# Patient Record
Sex: Male | Born: 1960 | Race: Black or African American | Hispanic: No | Smoking: Current every day smoker
Health system: Southern US, Community
[De-identification: ages and names within clinical notes are randomized; demographics above are authoritative.]

## PROBLEM LIST (undated history)

## (undated) DIAGNOSIS — G8929 Other chronic pain: Secondary | ICD-10-CM

## (undated) DIAGNOSIS — K219 Gastro-esophageal reflux disease without esophagitis: Secondary | ICD-10-CM

## (undated) DIAGNOSIS — F4310 Post-traumatic stress disorder, unspecified: Secondary | ICD-10-CM

## (undated) DIAGNOSIS — I10 Essential (primary) hypertension: Secondary | ICD-10-CM

## (undated) DIAGNOSIS — E7800 Pure hypercholesterolemia, unspecified: Secondary | ICD-10-CM

## (undated) DIAGNOSIS — E119 Type 2 diabetes mellitus without complications: Secondary | ICD-10-CM

## (undated) DIAGNOSIS — R569 Unspecified convulsions: Secondary | ICD-10-CM

## (undated) DIAGNOSIS — F319 Bipolar disorder, unspecified: Secondary | ICD-10-CM

## (undated) DIAGNOSIS — F1910 Other psychoactive substance abuse, uncomplicated: Secondary | ICD-10-CM

## (undated) DIAGNOSIS — M549 Dorsalgia, unspecified: Secondary | ICD-10-CM

## (undated) HISTORY — DX: Gastro-esophageal reflux disease without esophagitis: K21.9

## (undated) HISTORY — PX: OTHER SURGICAL HISTORY: SHX169

---

## 2000-06-23 ENCOUNTER — Emergency Department (HOSPITAL_COMMUNITY): Admission: EM | Admit: 2000-06-23 | Discharge: 2000-06-23 | Payer: Self-pay | Admitting: Emergency Medicine

## 2000-06-23 ENCOUNTER — Encounter: Payer: Self-pay | Admitting: Emergency Medicine

## 2000-09-23 ENCOUNTER — Encounter: Payer: Self-pay | Admitting: Emergency Medicine

## 2000-09-23 ENCOUNTER — Emergency Department (HOSPITAL_COMMUNITY): Admission: EM | Admit: 2000-09-23 | Discharge: 2000-09-23 | Payer: Self-pay | Admitting: Emergency Medicine

## 2001-04-24 ENCOUNTER — Emergency Department (HOSPITAL_COMMUNITY): Admission: EM | Admit: 2001-04-24 | Discharge: 2001-04-24 | Payer: Self-pay | Admitting: Emergency Medicine

## 2003-10-14 ENCOUNTER — Emergency Department (HOSPITAL_COMMUNITY): Admission: EM | Admit: 2003-10-14 | Discharge: 2003-10-14 | Payer: Self-pay | Admitting: Emergency Medicine

## 2003-10-16 ENCOUNTER — Emergency Department (HOSPITAL_COMMUNITY): Admission: EM | Admit: 2003-10-16 | Discharge: 2003-10-16 | Payer: Self-pay | Admitting: Emergency Medicine

## 2004-09-29 ENCOUNTER — Emergency Department: Payer: Self-pay | Admitting: Emergency Medicine

## 2005-10-05 ENCOUNTER — Emergency Department (HOSPITAL_COMMUNITY): Admission: EM | Admit: 2005-10-05 | Discharge: 2005-10-05 | Payer: Self-pay | Admitting: Emergency Medicine

## 2008-07-12 ENCOUNTER — Emergency Department (HOSPITAL_COMMUNITY): Admission: EM | Admit: 2008-07-12 | Discharge: 2008-07-12 | Payer: Self-pay | Admitting: Emergency Medicine

## 2008-07-29 ENCOUNTER — Emergency Department (HOSPITAL_COMMUNITY): Admission: EM | Admit: 2008-07-29 | Discharge: 2008-07-29 | Payer: Self-pay | Admitting: Emergency Medicine

## 2010-05-26 LAB — GLUCOSE, CAPILLARY: Glucose-Capillary: 92 mg/dL (ref 70–99)

## 2011-06-16 ENCOUNTER — Emergency Department (HOSPITAL_COMMUNITY): Payer: Self-pay

## 2011-06-16 ENCOUNTER — Encounter (HOSPITAL_COMMUNITY): Payer: Self-pay | Admitting: *Deleted

## 2011-06-16 ENCOUNTER — Emergency Department (HOSPITAL_COMMUNITY)
Admission: EM | Admit: 2011-06-16 | Discharge: 2011-06-16 | Disposition: A | Discharge status: Home / Self Care | Payer: Self-pay | Attending: Emergency Medicine | Admitting: Emergency Medicine

## 2011-06-16 DIAGNOSIS — S92919A Unspecified fracture of unspecified toe(s), initial encounter for closed fracture: Secondary | ICD-10-CM | POA: Insufficient documentation

## 2011-06-16 DIAGNOSIS — F319 Bipolar disorder, unspecified: Secondary | ICD-10-CM | POA: Insufficient documentation

## 2011-06-16 DIAGNOSIS — Y92009 Unspecified place in unspecified non-institutional (private) residence as the place of occurrence of the external cause: Secondary | ICD-10-CM | POA: Insufficient documentation

## 2011-06-16 DIAGNOSIS — M79609 Pain in unspecified limb: Secondary | ICD-10-CM | POA: Insufficient documentation

## 2011-06-16 DIAGNOSIS — W208XXA Other cause of strike by thrown, projected or falling object, initial encounter: Secondary | ICD-10-CM | POA: Insufficient documentation

## 2011-06-16 HISTORY — DX: Bipolar disorder, unspecified: F31.9

## 2011-06-16 MED ORDER — HYDROCODONE-ACETAMINOPHEN 5-325 MG PO TABS
1.0000 | ORAL_TABLET | Freq: Once | ORAL | Status: AC
  Administered 2011-06-16: 1 via ORAL
  Filled 2011-06-16: qty 1

## 2011-06-16 MED ORDER — HYDROCODONE-ACETAMINOPHEN 5-325 MG PO TABS: 1.0000 | ORAL_TABLET | ORAL | Status: AC | PRN

## 2011-06-16 NOTE — Discharge Instructions (Signed)
Toe Fracture Your caregiver has diagnosed you as having a fractured toe. A toe fracture is a break in the bone of a toe. "Buddy taping" is a way of splinting your broken toe, by taping the broken toe to the toe next to it. This "buddy taping" will keep the injured toe from moving beyond normal range of motion. Buddy taping also helps the toe heal in a more normal alignment. It may take 6 to 8 weeks for the toe injury to heal. HOME CARE INSTRUCTIONS   Leave your toes taped together for as long as directed by your caregiver or until you see a doctor for a follow-up examination. You can change the tape after bathing. Always use a small piece of gauze or cotton between the toes when taping them together. This will help the skin stay dry and prevent infection.   Apply ice to the injury for 15 to 20 minutes each hour while awake for the first 2 days. Put the ice in a plastic bag and place a towel between the bag of ice and your skin.   After the first 2 days, apply heat to the injured area. Use heat for the next 2 to 3 days. Place a heating pad on the foot or soak the foot in warm water as directed by your caregiver.   Keep your foot elevated as much as possible to lessen swelling.   Wear sturdy, supportive shoes. The shoes should not pinch the toes or fit tightly against the toes.   Your caregiver may prescribe a rigid shoe if your foot is very swollen.   Your may be given crutches if the pain is too great and it hurts too much to walk.   Only take over-the-counter or prescription medicines for pain, discomfort, or fever as directed by your caregiver.   If your caregiver has given you a follow-up appointment, it is very important to keep that appointment. Not keeping the appointment could result in a chronic or permanent injury, pain, and disability. If there is any problem keeping the appointment, you must call back to this facility for assistance.  SEEK MEDICAL CARE IF:   You have increased pain  or swelling, not relieved with medications.   The pain does not get better after 1 week.   Your injured toe is cold when the others are warm.  SEEK IMMEDIATE MEDICAL CARE IF:   The toe becomes cold, numb, or white.   The toe becomes hot (inflamed) and red.  Document Released: 01/30/2000 Document Revised: 01/21/2011 Document Reviewed: 09/18/2007 [facility] Patient Information 2012 [place], [place].   You may take the hydrocodone prescribed for pain relief.  This will make you drowsy - do not drive within 4 hours of taking this medication.

## 2011-06-16 NOTE — ED Provider Notes (Signed)
History     CSN: [id number]  Arrival date & time 06/16/11  0906   First MD Initiated Contact with Patient 06/16/11 [phone]      Chief Complaint  Patient presents with  . Foot Injury    (Consider location/radiation/quality/duration/timing/severity/associated sxs/prior treatment) HPI Comments: Victor Palmer presents for evaluation of left foot pain after dropping a large TV on his distal foot yesterday evening.  He had immediate pain which has worsened upon waking this morning.  He has increased pain with weightbearing and attempts to flex his toes.  He has used ice and elevation with some relief, he has taken no medication this morning.  The pain does not radiate and it is constant and sharp.  The history is provided by the patient.    Past Medical History  Diagnosis Date  . Bipolar 1 disorder     No past surgical history on file.  No family history on file.  History  Substance Use Topics  . Smoking status: Not on file  . Smokeless tobacco: Not on file  . Alcohol Use:       Review of Systems  Musculoskeletal: Positive for joint swelling and arthralgias.  Skin: Negative for wound.  Neurological: Positive for weakness.    Allergies  Review of patient's allergies indicates no known allergies.  Home Medications   Current Outpatient Rx  Name Route Sig Dispense Refill  . HYDROCODONE-ACETAMINOPHEN 5-325 MG PO TABS Oral Take 1 tablet by mouth every 4 (four) hours as needed for pain. 20 tablet 0    BP 144/103  Pulse 82  Resp 16  Ht 5\' 10"  (1.778 m)  Wt 155 lb (70.308 kg)  BMI 22.24 kg/m2  SpO2 98%  Physical Exam  Nursing note and vitals reviewed. Constitutional: He appears well-developed and well-nourished.  HENT:  Head: Normocephalic.  Cardiovascular: Normal rate and intact distal pulses.  Exam reveals no decreased pulses.   Pulses:      Dorsalis pedis pulses are 2+ on the right side, and 2+ on the left side.       Posterior tibial pulses are 2+ on the right  side, and 2+ on the left side.  Musculoskeletal: He exhibits edema and tenderness.       Left foot: He exhibits decreased range of motion, bony tenderness and swelling. He exhibits no deformity.       Feet:  Neurological: He is alert. No sensory deficit.  Skin: Skin is warm, dry and intact.    ED Course  Procedures (including critical care time)  Labs Reviewed - No data to display Dg Foot Complete Left  06/16/2011  *RADIOLOGY REPORT*  Clinical Data: Type of left foot two injury.  LEFT FOOT - COMPLETE 3+ VIEW  Comparison: None.  Findings: a subtle nondisplaced fracture is seen in the proximal phalanx of the second toe.  There appears to be some subtle periosteal reaction associated with this fracture suggesting it may be subacute.  No other fracture is evident.  There is no subluxation or dislocation.  No worrisome lytic or sclerotic osseous abnormality.  IMPRESSION: The nondisplaced transverse fracture in the proximal phalanx of the second toe.  Apparent subtle periosteal reaction suggest that this may be subacute in chronicity.  Original Report Authenticated By: ERIC A. MANSELL, M.D.     1. Fracture of phalanx of toe       MDM  Buddy tape and postop shoe applied.  Hydrocodone prescribed.  Ice and elevation recommended.  Referral to Dr. Romeo Apple  for recheck of his injury within the next several days.        Burgess Amor, PA 06/16/11 1031

## 2011-06-16 NOTE — ED Notes (Signed)
Pt states he dropped a very large TV no his left foot last night. Bruising and swelling to top of foot are toes. NAD. Pt denies pain when lying but states pain of 10 with ambulating.

## 2011-06-16 NOTE — ED Notes (Signed)
Pt advised to keep a check on BP. Pt states he has been stressed lately. States he will continue to check BP and follow up with [facility] if needed.

## 2011-06-16 NOTE — ED Provider Notes (Signed)
Medical screening examination/treatment/procedure(s) were performed by non-physician practitioner and as supervising physician I was immediately available for consultation/collaboration.   Carleene Cooper III, MD 06/16/11 2016

## 2011-10-03 ENCOUNTER — Emergency Department (HOSPITAL_COMMUNITY)
Admission: EM | Admit: 2011-10-03 | Discharge: 2011-10-03 | Disposition: A | Discharge status: Home / Self Care | Payer: Self-pay | Attending: Emergency Medicine | Admitting: Emergency Medicine

## 2011-10-03 ENCOUNTER — Encounter (HOSPITAL_COMMUNITY): Payer: Self-pay | Admitting: *Deleted

## 2011-10-03 DIAGNOSIS — L729 Follicular cyst of the skin and subcutaneous tissue, unspecified: Secondary | ICD-10-CM

## 2011-10-03 DIAGNOSIS — F17200 Nicotine dependence, unspecified, uncomplicated: Secondary | ICD-10-CM | POA: Insufficient documentation

## 2011-10-03 DIAGNOSIS — L723 Sebaceous cyst: Secondary | ICD-10-CM | POA: Insufficient documentation

## 2011-10-03 DIAGNOSIS — E119 Type 2 diabetes mellitus without complications: Secondary | ICD-10-CM | POA: Insufficient documentation

## 2011-10-03 DIAGNOSIS — F319 Bipolar disorder, unspecified: Secondary | ICD-10-CM | POA: Insufficient documentation

## 2011-10-03 NOTE — ED Notes (Signed)
Pt has large area of swelling at his left eyebrow. Pt states that it has been there for a month. He tried to pop it a week ago and didn't get any drainage. Has gotten larger.

## 2011-10-03 NOTE — ED Provider Notes (Signed)
History     CSN: [id number]  Arrival date & time 10/03/11  1455   None     Chief Complaint  Patient presents with  . Abscess    (Consider location/radiation/quality/duration/timing/severity/associated sxs/prior treatment) HPI Comments: Patient states that he noted a pimple-type area over the left eyebrow more than a month ago. It seemed to be getting larger and so he was attempting to squeeze it to rupture it. It got even larger and he presents now to the emergency department to have it evaluated and possibly lanced. He has not had fever or chills. He's not had red streaking from the area. It has not affected his vision and there's been no drainage. The patient has not taken anything for this lesion. It is of note that the patient has cyst on the dorsal of his wrist.  The history is provided by the patient.    Past Medical History  Diagnosis Date  . Bipolar 1 disorder   . Diabetes mellitus     History reviewed. No pertinent past surgical history.  History reviewed. No pertinent family history.  History  Substance Use Topics  . Smoking status: Current Everyday Smoker    Types: Cigarettes  . Smokeless tobacco: Not on file  . Alcohol Use: Yes      Review of Systems  Constitutional: Negative for activity change.       All ROS Neg except as noted in HPI  HENT: Negative for nosebleeds and neck pain.   Eyes: Negative for photophobia and discharge.  Respiratory: Negative for cough, shortness of breath and wheezing.   Cardiovascular: Negative for chest pain and palpitations.  Gastrointestinal: Negative for abdominal pain and blood in stool.  Genitourinary: Negative for dysuria, frequency and hematuria.  Musculoskeletal: Negative for back pain and arthralgias.  Skin: Negative.   Neurological: Negative for dizziness, seizures and speech difficulty.  Psychiatric/Behavioral: Negative for hallucinations and confusion.       Bipolar effects    Allergies  Review of patient's  allergies indicates no known allergies.  Home Medications  No current outpatient prescriptions on file.  BP 154/101  Pulse 78  Temp 98.4 F (36.9 C) (Oral)  Resp 18  Ht 5\' 10"  (1.778 m)  Wt 160 lb (72.576 kg)  BMI 22.96 kg/m2  SpO2 100%  Physical Exam  Nursing note and vitals reviewed. Constitutional: He is oriented to person, place, and time. He appears well-developed and well-nourished.  Non-toxic appearance.  HENT:  Head: Normocephalic.  Right Ear: Tympanic membrane and external ear normal.  Left Ear: Tympanic membrane and external ear normal.  Eyes: EOM and lids are normal. Pupils are equal, round, and reactive to light.       There is a been size cyst of the skin over the left eye in the eyebrow area. It is firm to touch. It is not hot and there is no red streaking appreciated.  The extraocular movement is intact,  the anterior chamber is clear. The the disc are sharp and flat on the left.  Neck: Normal range of motion. Neck supple. Carotid bruit is not present.  Cardiovascular: Normal rate, regular rhythm, normal heart sounds, intact distal pulses and normal pulses.   Pulmonary/Chest: Breath sounds normal. No respiratory distress.  Abdominal: Soft. Bowel sounds are normal. There is no tenderness. There is no guarding.  Musculoskeletal: Normal range of motion.  Lymphadenopathy:       Head (right side): No submandibular adenopathy present.       Head (left  side): No submandibular adenopathy present.    He has no cervical adenopathy.  Neurological: He is alert and oriented to person, place, and time. He has normal strength. No cranial nerve deficit or sensory deficit.  Skin: Skin is warm and dry.  Psychiatric: He has a normal mood and affect. His speech is normal.    ED Course  Procedures (including critical care time)  Labs Reviewed - No data to display No results found.   1. Cyst of skin       MDM  I have reviewed nursing notes, vital signs, and all  appropriate lab and imaging results for this patient. Patient has a cyst of the skin over the left eyebrow. There was no evidence of fluctuance or no examination consistent with abscess. Patient advised to see the surgeon for evaluation and removal of this cyst. Patient further advised not to squeeze it or puncture at at this time.       Kathie Dike, PA 10/03/11 1542  Kathie Dike, [place] 10/03/11 [phone]

## 2011-10-04 NOTE — ED Provider Notes (Signed)
Medical screening examination/treatment/procedure(s) were performed by non-physician practitioner and as supervising physician I was immediately available for consultation/collaboration.   Tomi Paddock III, MD 10/04/11 0010 

## 2011-10-30 ENCOUNTER — Emergency Department (HOSPITAL_COMMUNITY)
Admission: EM | Admit: 2011-10-30 | Discharge: 2011-10-30 | Disposition: A | Discharge status: Home / Self Care | Payer: Self-pay | Attending: Emergency Medicine | Admitting: Emergency Medicine

## 2011-10-30 ENCOUNTER — Encounter (HOSPITAL_COMMUNITY): Payer: Self-pay | Admitting: Emergency Medicine

## 2011-10-30 DIAGNOSIS — S058X9A Other injuries of unspecified eye and orbit, initial encounter: Secondary | ICD-10-CM | POA: Insufficient documentation

## 2011-10-30 DIAGNOSIS — F319 Bipolar disorder, unspecified: Secondary | ICD-10-CM | POA: Insufficient documentation

## 2011-10-30 DIAGNOSIS — F17200 Nicotine dependence, unspecified, uncomplicated: Secondary | ICD-10-CM | POA: Insufficient documentation

## 2011-10-30 DIAGNOSIS — Y849 Medical procedure, unspecified as the cause of abnormal reaction of the patient, or of later complication, without mention of misadventure at the time of the procedure: Secondary | ICD-10-CM | POA: Insufficient documentation

## 2011-10-30 DIAGNOSIS — S01112A Laceration without foreign body of left eyelid and periocular area, initial encounter: Secondary | ICD-10-CM

## 2011-10-30 DIAGNOSIS — E119 Type 2 diabetes mellitus without complications: Secondary | ICD-10-CM | POA: Insufficient documentation

## 2011-10-30 MED ORDER — "THROMBI-PAD 3""X3"" EX PADS"
MEDICATED_PAD | CUTANEOUS | Status: AC
  Administered 2011-10-30: 22:00:00
  Filled 2011-10-30: qty 1

## 2011-10-30 MED ORDER — LIDOCAINE-EPINEPHRINE (PF) 2 %-1:200000 IJ SOLN
INTRAMUSCULAR | Status: AC
  Administered 2011-10-30: 20 mL
  Filled 2011-10-30: qty 20

## 2011-10-30 NOTE — ED Provider Notes (Signed)
Pt had a lesion removed from his RU eyelid yesterday. Reports it started bleeding as he was driving home and is continuing to bleed. Pt has a large clot in his upper right eyelid just under the mid eyebrow with dripping of blood. The clot was removed by PA Idol and he has a linear horizontal laceration without obvious sutures with some gaping of the edges. Quick clot was cut to size and placed on wound.   Medical screening examination/treatment/procedure(s) were conducted as a shared visit with non-physician practitioner(s) and myself.  I personally evaluated the patient during the encounter  Devoria Albe, MD, Franz Dell, MD 10/31/11 [phone]

## 2011-10-30 NOTE — ED Notes (Signed)
Patient with no complaints at this time. Respirations even and unlabored. Skin warm/dry. Discharge instructions reviewed with patient at this time. Patient given opportunity to voice concerns/ask questions. Patient discharged at this time and left Emergency Department with steady gait.   

## 2011-10-30 NOTE — ED Notes (Signed)
Patient reports cyst above R eye removed in MD office yesterday and has con't. To bleed since then.  Removed bandage from above R occiput. Portion of dark, coagulated blood w/lower border redder and more gelatinous.  Encouraged patient not to rub area which may dislodge clot.

## 2011-10-30 NOTE — ED Notes (Signed)
Quick-clot gauze applied to suture site for further oozing.  Pressure held x 5 min. No bloody show through gauze.  Applied adhesive coverlet.  Gave remaining quick-clot gauze to patient w/instructions for use.  Instructed him to leave current dressing on for 24 hours if he had no bleed through. Instructed him to return to ER if continued bleed through requiring multiple dressing changes.

## 2011-10-30 NOTE — ED Notes (Signed)
Patient states he had a cyst removed from left eyelid yesterday and "it keeps bleeding. I think the stitches are loose or something."

## 2011-10-31 ENCOUNTER — Emergency Department (HOSPITAL_COMMUNITY)
Admission: EM | Admit: 2011-10-31 | Discharge: 2011-10-31 | Disposition: A | Discharge status: Home / Self Care | Payer: Self-pay | Attending: Emergency Medicine | Admitting: Emergency Medicine

## 2011-10-31 ENCOUNTER — Encounter (HOSPITAL_COMMUNITY): Payer: Self-pay | Admitting: Emergency Medicine

## 2011-10-31 DIAGNOSIS — S01112A Laceration without foreign body of left eyelid and periocular area, initial encounter: Secondary | ICD-10-CM

## 2011-10-31 DIAGNOSIS — Z79899 Other long term (current) drug therapy: Secondary | ICD-10-CM | POA: Insufficient documentation

## 2011-10-31 LAB — CBC
MCHC: 34.2 g/dL (ref 30.0–36.0)
Platelets: 265 10*3/uL (ref 150–400)
RDW: 15.1 % (ref 11.5–15.5)

## 2011-10-31 LAB — COMPREHENSIVE METABOLIC PANEL
AST: 22 U/L (ref 0–37)
Albumin: 3.8 g/dL (ref 3.5–5.2)
Calcium: 9.8 mg/dL (ref 8.4–10.5)
Creatinine, Ser: 1.2 mg/dL (ref 0.50–1.35)

## 2011-10-31 LAB — PROTIME-INR
INR: 1.02 (ref 0.00–1.49)
Prothrombin Time: 13.6 seconds (ref 11.6–15.2)

## 2011-10-31 LAB — GLUCOSE, CAPILLARY: Glucose-Capillary: 121 mg/dL — ABNORMAL HIGH (ref 70–99)

## 2011-10-31 MED ORDER — DOUBLE ANTIBIOTIC 500-10000 UNIT/GM EX OINT
TOPICAL_OINTMENT | Freq: Once | CUTANEOUS | Status: AC
  Administered 2011-10-31: 20:00:00 via TOPICAL
  Filled 2011-10-31: qty 1

## 2011-10-31 NOTE — ED Notes (Signed)
J. Idol, PA at bedside. 

## 2011-10-31 NOTE — ED Provider Notes (Signed)
Patient was seen by myself in the PA yesterday for persistent bleeding after having a growth removed from his left upper eyelid by ophthalmolgist, Dr Randon Goldsmith. My PA placed 5 sutures in it last night and he was given quick clot to put on it however he states the bleeding restarted again this afternoon about 2 PM. On observation the wound appears more approximated that it did yesterday however he quickly wells up with blood.  Medical screening examination/treatment/procedure(s) were conducted as a shared visit with non-physician practitioner(s) and myself.  I personally evaluated the patient during the encounter Devoria Albe, MD, Franz Dell, MD 10/31/11 Ebony Cargo

## 2011-10-31 NOTE — ED Notes (Signed)
Cyst removed above L eye x 2 days ago. Had to come back yesterday due to would not stop bleeding. Had stitches put in yesterday. Pt states area started bleeding again today around 2pm. Nad. Is not bleeding through bandage at this time.

## 2011-10-31 NOTE — ED Provider Notes (Signed)
See prior note   Ward Givens, MD 10/31/11 1940

## 2011-10-31 NOTE — ED Notes (Signed)
Pt still continues to having bleeding from laceration to left upper eye lid, pt recently had a cyst removed

## 2011-10-31 NOTE — ED Provider Notes (Signed)
History     CSN: [id number]  Arrival date & time 10/30/11  1940   First MD Initiated Contact with Patient 10/30/11 1958      Chief Complaint  Patient presents with  . Wound Check    (Consider location/radiation/quality/duration/timing/severity/associated sxs/prior treatment) HPI Comments: Francis T Collamore presents with bleeding from an incision site to his left eyebrow from a cystic structure that was removed by Dr. Randon Goldsmith in his office yesterday.  Patient reports that the site started bleeding about an hour after the procedure was performed, but by then Dr. Randon Goldsmith office was closed.  He continues to bleed despite applying pressure and using an ice pack to the site.  The area is sore, but otherwise is comfortable.  He denies weakness, lightheadedness and fevers.  He denies a history of bleeding disorders or unexplained bruising.  He does take aspirin prn,  Last dose 2 day ago.  He consumes approximately 24 oz of beer daily.  He denies a history of liver problems.  The history is provided by the patient.    Past Medical History  Diagnosis Date  . Bipolar 1 disorder   . Diabetes mellitus     History reviewed. No pertinent past surgical history.  History reviewed. No pertinent family history.  History  Substance Use Topics  . Smoking status: Current Every Day Smoker    Types: Cigarettes  . Smokeless tobacco: Not on file  . Alcohol Use: Yes     daily      Review of Systems  Constitutional: Negative for fever and chills.  HENT: Negative for facial swelling.   Respiratory: Negative for shortness of breath and wheezing.   Skin: Positive for wound.  Neurological: Negative for dizziness and light-headedness.  Hematological: Does not bruise/bleed easily.    Allergies  Review of patient's allergies indicates no known allergies.  Home Medications   Current Outpatient Rx  Name Route Sig Dispense Refill  . BACITRACIN ZINC 500 UNIT/GM EX OINT Topical Apply topically 2 (two) times  daily.      BP 139/108  Pulse 88  Temp 98.4 F (36.9 C) (Oral)  Resp 16  Ht 5\' 10"  (1.778 m)  Wt 160 lb (72.576 kg)  BMI 22.96 kg/m2  SpO2 98%  Physical Exam  Constitutional: He is oriented to person, place, and time. He appears well-developed and well-nourished.  HENT:  Head: Normocephalic.  Cardiovascular: Normal rate.   Pulmonary/Chest: Effort normal.  Neurological: He is alert and oriented to person, place, and time. No sensory deficit.  Skin: Bruising, ecchymosis and laceration noted.       2 cm incision across left upper orbital rim which is approximated on the distal aspect,  But gaping along the medial edge with apparent subcutaneous tacking sutures which are not visible.  Faint periorbital ecchymosis appreciated.    ED Course  Procedures (including critical care time)  Labs Reviewed - No data to display No results found.   1. Laceration of eyelid, left    Direct pressure was applied to the wound edge using first quikclot,  Then surgicel without resolution of bleeding.  Sutures were then added followed by direct pressure again using quikclot.  Pt was hemostatic at time of discharge.  LACERATION REPAIR Performed by: Burgess Amor Authorized by: Burgess Amor Consent: Verbal consent obtained. Risks and benefits: risks, benefits and alternatives were discussed Consent given by: patient Patient identity confirmed: provided demographic data Prepped and Draped in normal sterile fashion Wound explored  Laceration Location: left brow  Laceration Length: 2cm  No Foreign Bodies seen or palpated  Anesthesia: local infiltration  Local anesthetic: lidocaine 2% with epinephrine  Anesthetic total: 1.5 ml  Irrigation method: syringe Amount of cleaning: standard  Skin closure: ethilon 6-0  Number of sutures: 5  Technique: simple interrupted  Patient tolerance: Patient tolerated the procedure well with no immediate complications.    MDM  PRN f/u.  Pt has f/u  appt with Dr Randon Goldsmith in 8 days - advised suture removal at that time,  Recheck sooner for any complication.        Burgess Amor, [place] 10/31/11 1818

## 2011-11-25 NOTE — ED Provider Notes (Signed)
History     CSN: [id number]  Arrival date & time 10/31/11  1723   First MD Initiated Contact with Patient 10/31/11 1743      Chief Complaint  Patient presents with  . Wound Check    (Consider location/radiation/quality/duration/timing/severity/associated sxs/prior treatment) HPI Comments: Honorio T Siebenaler presents for re-evaluation of his left upper eyelid/brow incision which continues to ooze blood.  He had a cystic structure removed from the site by Dr. Randon Goldsmith, ophthalmologist 2 days ago.  He was seen here yesterday and several sutures was placed for improved approximation of the wound,  But it continues to slowly bleed.  The patient denies any trauma to the site,  And denies any previous issues with bleeding disorders.  He denies weakness or lightheadedness,  Just annoyance over this continued slow bleed.  He takes aspirin daily but not in the past 3 days.  He denies history of liver problems, but does drink etoh daily.  The history is provided by the patient.    Past Medical History  Diagnosis Date  . Bipolar 1 disorder   . Diabetes mellitus     History reviewed. No pertinent past surgical history.  History reviewed. No pertinent family history.  History  Substance Use Topics  . Smoking status: Current Every Day Smoker    Types: Cigarettes  . Smokeless tobacco: Not on file  . Alcohol Use: Yes     daily      Review of Systems  Constitutional: Negative for fever.  HENT: Negative for nosebleeds and sore throat.   Eyes:       As noted.  Respiratory: Negative for shortness of breath.   Cardiovascular: Negative for chest pain.  Gastrointestinal: Negative for nausea and abdominal pain.  Genitourinary: Negative.   Musculoskeletal: Negative for joint swelling and arthralgias.  Skin: Positive for wound. Negative for color change.  Neurological: Negative for dizziness, weakness, light-headedness, numbness and headaches.  Hematological: Negative.  Does not bruise/bleed easily.    Psychiatric/Behavioral: Negative.     Allergies  Review of patient's allergies indicates no known allergies.  Home Medications   Current Outpatient Rx  Name Route Sig Dispense Refill  . BACITRACIN ZINC 500 UNIT/GM EX OINT Topical Apply topically 2 (two) times daily.      BP 126/95  Pulse 87  Temp 98.5 F (36.9 C)  Resp 16  SpO2 100%  Physical Exam  Nursing note and vitals reviewed. Constitutional: He appears well-developed and well-nourished.  HENT:  Head: Atraumatic.  Eyes: Conjunctivae normal are normal.       Modest ecchymosis noted around left infraorbital area.  Well approximated incision left browline with slow ooze of blood which stops temporarily with pressure.  No hematoma.  Neck: Normal range of motion.  Cardiovascular: Normal rate, regular rhythm, normal heart sounds and intact distal pulses.   Pulmonary/Chest: Effort normal and breath sounds normal. He has no wheezes.  Abdominal: Soft. Bowel sounds are normal. There is no tenderness.  Musculoskeletal: Normal range of motion.  Neurological: He is alert.  Skin: Skin is warm and dry.  Psychiatric: He has a normal mood and affect.    ED Course  Procedures (including critical care time)  Labs Reviewed  GLUCOSE, CAPILLARY - Abnormal; Notable for the following:    Glucose-Capillary 121 (*)     All other components within normal limits  COMPREHENSIVE METABOLIC PANEL - Abnormal; Notable for the following:    Glucose, Bld 106 (*)     Total Bilirubin 0.1 (*)  GFR calc non Af Amer 69 (*)     GFR calc Af Amer 80 (*)     All other components within normal limits  CBC  PROTIME-INR  LAB REPORT - SCANNED   No results found.   1. Laceration of eyelid, left    Wound redressed using gelfoam as first layer.     MDM  Discussed case with Dr. Randon Goldsmith.  Labs reviewed and stable.  Encouraged gentle pressure,  Dressing changes as needed.  Dr. Randon Goldsmith will recheck pt in office tomorrow  - pt to call for appt  time.        Burgess Amor, PA 11/25/11 1721

## 2011-11-26 NOTE — ED Provider Notes (Signed)
Medical screening examination/treatment/procedure(s) were performed by non-physician practitioner and as supervising physician I was immediately available for consultation/collaboration. Klarisa Barman, MD, FACEP   Tynisha Ogan L Mekenzie Modeste, MD 11/26/11 1503 

## 2012-12-27 ENCOUNTER — Encounter (HOSPITAL_COMMUNITY): Payer: Self-pay | Admitting: Emergency Medicine

## 2012-12-27 ENCOUNTER — Inpatient Hospital Stay (HOSPITAL_COMMUNITY): Payer: Worker's Compensation

## 2012-12-27 ENCOUNTER — Inpatient Hospital Stay (HOSPITAL_COMMUNITY)
Admission: EM | Admit: 2012-12-27 | Discharge: 2012-12-29 | DRG: 101 | Disposition: A | Discharge status: Home / Self Care | Payer: Worker's Compensation | Attending: Family Medicine | Admitting: Family Medicine

## 2012-12-27 ENCOUNTER — Inpatient Hospital Stay (HOSPITAL_COMMUNITY)
Admit: 2012-12-27 | Discharge: 2012-12-27 | Disposition: A | Discharge status: Home / Self Care | Payer: Worker's Compensation | Attending: Family Medicine | Admitting: Family Medicine

## 2012-12-27 ENCOUNTER — Emergency Department (HOSPITAL_COMMUNITY): Payer: Worker's Compensation

## 2012-12-27 DIAGNOSIS — D72829 Elevated white blood cell count, unspecified: Secondary | ICD-10-CM | POA: Diagnosis present

## 2012-12-27 DIAGNOSIS — E119 Type 2 diabetes mellitus without complications: Secondary | ICD-10-CM | POA: Diagnosis present

## 2012-12-27 DIAGNOSIS — R569 Unspecified convulsions: Principal | ICD-10-CM

## 2012-12-27 DIAGNOSIS — M25511 Pain in right shoulder: Secondary | ICD-10-CM

## 2012-12-27 DIAGNOSIS — S0001XA Abrasion of scalp, initial encounter: Secondary | ICD-10-CM

## 2012-12-27 DIAGNOSIS — Y92410 Unspecified street and highway as the place of occurrence of the external cause: Secondary | ICD-10-CM

## 2012-12-27 DIAGNOSIS — I10 Essential (primary) hypertension: Secondary | ICD-10-CM | POA: Diagnosis present

## 2012-12-27 DIAGNOSIS — M6282 Rhabdomyolysis: Secondary | ICD-10-CM | POA: Diagnosis present

## 2012-12-27 DIAGNOSIS — IMO0002 Reserved for concepts with insufficient information to code with codable children: Secondary | ICD-10-CM | POA: Diagnosis present

## 2012-12-27 DIAGNOSIS — F17200 Nicotine dependence, unspecified, uncomplicated: Secondary | ICD-10-CM | POA: Diagnosis present

## 2012-12-27 DIAGNOSIS — M75100 Unspecified rotator cuff tear or rupture of unspecified shoulder, not specified as traumatic: Secondary | ICD-10-CM | POA: Diagnosis present

## 2012-12-27 DIAGNOSIS — Z87898 Personal history of other specified conditions: Secondary | ICD-10-CM

## 2012-12-27 DIAGNOSIS — F319 Bipolar disorder, unspecified: Secondary | ICD-10-CM

## 2012-12-27 DIAGNOSIS — S0990XA Unspecified injury of head, initial encounter: Secondary | ICD-10-CM | POA: Diagnosis present

## 2012-12-27 HISTORY — DX: Type 2 diabetes mellitus without complications: E11.9

## 2012-12-27 LAB — TROPONIN I
Troponin I: <0.3 ng/mL (ref <0.30)
Troponin I: <0.3 ng/mL (ref <0.30)
Troponin I: <0.3 ng/mL (ref <0.30)

## 2012-12-27 LAB — URINALYSIS, ROUTINE W REFLEX MICROSCOPIC
Ketones, ur: NEGATIVE mg/dL
Leukocytes, UA: NEGATIVE
Nitrite: NEGATIVE
Specific Gravity, Urine: 1.02 (ref 1.005–1.030)
pH: 5.5 (ref 5.0–8.0)

## 2012-12-27 LAB — RAPID URINE DRUG SCREEN, HOSP PERFORMED
Barbiturates: NOT DETECTED
Opiates: NOT DETECTED
Tetrahydrocannabinol: NOT DETECTED

## 2012-12-27 LAB — COMPREHENSIVE METABOLIC PANEL
ALT: 20 U/L (ref 0–53)
Alkaline Phosphatase: 76 U/L (ref 39–117)
CO2: 30 mEq/L (ref 19–32)
Calcium: 9.9 mg/dL (ref 8.4–10.5)
Chloride: 100 mEq/L (ref 96–112)
GFR calc Af Amer: 86 mL/min — ABNORMAL LOW (ref >90)
GFR calc non Af Amer: 74 mL/min — ABNORMAL LOW (ref >90)
Glucose, Bld: 100 mg/dL — ABNORMAL HIGH (ref 70–99)
Sodium: 137 mEq/L (ref 135–145)
Total Bilirubin: 0.2 mg/dL — ABNORMAL LOW (ref 0.3–1.2)

## 2012-12-27 LAB — CBC WITH DIFFERENTIAL/PLATELET
Eosinophils Relative: 3 % (ref 0–5)
HCT: 41.1 % (ref 39.0–52.0)
Lymphocytes Relative: 44 % (ref 12–46)
Lymphs Abs: 6 10*3/uL — ABNORMAL HIGH (ref 0.7–4.0)
MCV: 85.1 fL (ref 78.0–100.0)
Monocytes Absolute: 1 10*3/uL (ref 0.1–1.0)
Neutro Abs: 6.2 10*3/uL (ref 1.7–7.7)
Platelets: 263 10*3/uL (ref 150–400)
RBC: 4.83 MIL/uL (ref 4.22–5.81)
WBC: 13.6 10*3/uL — ABNORMAL HIGH (ref 4.0–10.5)

## 2012-12-27 LAB — TSH: TSH: 1.245 u[IU]/mL (ref 0.350–4.500)

## 2012-12-27 LAB — URINE MICROSCOPIC-ADD ON

## 2012-12-27 LAB — HEMOGLOBIN A1C: Mean Plasma Glucose: 117 mg/dL — ABNORMAL HIGH (ref <117)

## 2012-12-27 LAB — PHOSPHORUS: Phosphorus: 3.8 mg/dL (ref 2.3–4.6)

## 2012-12-27 LAB — MAGNESIUM: Magnesium: 1.9 mg/dL (ref 1.5–2.5)

## 2012-12-27 LAB — GLUCOSE, CAPILLARY

## 2012-12-27 LAB — PRO B NATRIURETIC PEPTIDE: Pro B Natriuretic peptide (BNP): 12.7 pg/mL (ref 0–125)

## 2012-12-27 MED ORDER — SODIUM CHLORIDE 0.9 % IV SOLN: INTRAVENOUS | Status: DC

## 2012-12-27 MED ORDER — LORAZEPAM 2 MG/ML IJ SOLN
INTRAMUSCULAR | Status: AC
  Administered 2012-12-27: 2 mg via INTRAVENOUS
  Filled 2012-12-27: qty 1

## 2012-12-27 MED ORDER — FOLIC ACID 1 MG PO TABS
1.0000 mg | ORAL_TABLET | Freq: Every day | ORAL | Status: DC
  Administered 2012-12-29: 1 mg via ORAL
  Filled 2012-12-27 (×3): qty 1

## 2012-12-27 MED ORDER — HEPARIN SODIUM (PORCINE) 5000 UNIT/ML IJ SOLN
5000.0000 [IU] | Freq: Three times a day (TID) | INTRAMUSCULAR | Status: DC
  Administered 2012-12-27 – 2012-12-29 (×8): 5000 [IU] via SUBCUTANEOUS
  Filled 2012-12-27 (×8): qty 1

## 2012-12-27 MED ORDER — MORPHINE SULFATE 2 MG/ML IJ SOLN
2.0000 mg | INTRAMUSCULAR | Status: AC
  Administered 2012-12-27: 2 mg via INTRAVENOUS
  Filled 2012-12-27: qty 1

## 2012-12-27 MED ORDER — NICOTINE 14 MG/24HR TD PT24
14.0000 mg | MEDICATED_PATCH | Freq: Every day | TRANSDERMAL | Status: DC
  Administered 2012-12-27 – 2012-12-29 (×3): 14 mg via TRANSDERMAL
  Filled 2012-12-27 (×3): qty 1

## 2012-12-27 MED ORDER — SODIUM CHLORIDE 0.9 % IV SOLN
INTRAVENOUS | Status: DC
  Administered 2012-12-28 (×4): via INTRAVENOUS

## 2012-12-27 MED ORDER — LEVETIRACETAM 500 MG/5ML IV SOLN
INTRAVENOUS | Status: AC
  Filled 2012-12-27: qty 10

## 2012-12-27 MED ORDER — ONDANSETRON HCL 4 MG PO TABS: 4.0000 mg | ORAL_TABLET | Freq: Four times a day (QID) | ORAL | Status: DC | PRN

## 2012-12-27 MED ORDER — LORAZEPAM 2 MG/ML IJ SOLN: 2.0000 mg | INTRAMUSCULAR | Status: DC | PRN

## 2012-12-27 MED ORDER — ADULT MULTIVITAMIN W/MINERALS CH
1.0000 | ORAL_TABLET | Freq: Every day | ORAL | Status: DC
  Administered 2012-12-29: 1 via ORAL
  Filled 2012-12-27 (×3): qty 1

## 2012-12-27 MED ORDER — MORPHINE SULFATE 2 MG/ML IJ SOLN
1.0000 mg | INTRAMUSCULAR | Status: DC | PRN
  Administered 2012-12-27: 1 mg via INTRAVENOUS
  Filled 2012-12-27: qty 1

## 2012-12-27 MED ORDER — TETANUS-DIPHTH-ACELL PERTUSSIS 5-2.5-18.5 LF-MCG/0.5 IM SUSP
0.5000 mL | Freq: Once | INTRAMUSCULAR | Status: AC
  Administered 2012-12-27: 0.5 mL via INTRAMUSCULAR
  Filled 2012-12-27: qty 0.5

## 2012-12-27 MED ORDER — HYDROCODONE-ACETAMINOPHEN 5-325 MG PO TABS
1.0000 | ORAL_TABLET | Freq: Four times a day (QID) | ORAL | Status: DC
  Administered 2012-12-27 – 2012-12-29 (×9): 1 via ORAL
  Filled 2012-12-27 (×9): qty 1

## 2012-12-27 MED ORDER — MORPHINE SULFATE 2 MG/ML IJ SOLN
2.0000 mg | INTRAMUSCULAR | Status: DC | PRN
  Administered 2012-12-28 – 2012-12-29 (×5): 2 mg via INTRAVENOUS
  Filled 2012-12-27 (×5): qty 1

## 2012-12-27 MED ORDER — LORAZEPAM 2 MG/ML IJ SOLN
INTRAMUSCULAR | Status: AC
  Filled 2012-12-27: qty 1

## 2012-12-27 MED ORDER — LEVETIRACETAM 500 MG PO TABS
500.0000 mg | ORAL_TABLET | Freq: Two times a day (BID) | ORAL | Status: DC
  Administered 2012-12-27 – 2012-12-29 (×4): 500 mg via ORAL
  Filled 2012-12-27 (×3): qty 1

## 2012-12-27 MED ORDER — BACITRACIN ZINC 500 UNIT/GM EX OINT
TOPICAL_OINTMENT | CUTANEOUS | Status: AC
  Administered 2012-12-27: 07:00:00
  Filled 2012-12-27: qty 0.9

## 2012-12-27 MED ORDER — ONDANSETRON HCL 4 MG/2ML IJ SOLN: 4.0000 mg | Freq: Four times a day (QID) | INTRAMUSCULAR | Status: DC | PRN

## 2012-12-27 MED ORDER — ACETAMINOPHEN 325 MG PO TABS
650.0000 mg | ORAL_TABLET | Freq: Four times a day (QID) | ORAL | Status: DC | PRN
  Administered 2012-12-27: 650 mg via ORAL
  Filled 2012-12-27: qty 2

## 2012-12-27 MED ORDER — SODIUM CHLORIDE 0.9 % IJ SOLN
3.0000 mL | Freq: Two times a day (BID) | INTRAMUSCULAR | Status: DC
  Administered 2012-12-27 – 2012-12-29 (×4): 3 mL via INTRAVENOUS

## 2012-12-27 MED ORDER — LEVETIRACETAM 500 MG/5ML IV SOLN
1000.0000 mg | Freq: Once | INTRAVENOUS | Status: AC
  Administered 2012-12-27: 1000 mg via INTRAVENOUS
  Filled 2012-12-27: qty 10

## 2012-12-27 MED ORDER — VITAMIN B-1 100 MG PO TABS
100.0000 mg | ORAL_TABLET | Freq: Every day | ORAL | Status: DC
  Administered 2012-12-29: 11:00:00 100 mg via ORAL
  Filled 2012-12-27 (×3): qty 1

## 2012-12-27 MED ORDER — ACETAMINOPHEN 650 MG RE SUPP: 650.0000 mg | Freq: Four times a day (QID) | RECTAL | Status: DC | PRN

## 2012-12-27 NOTE — Progress Notes (Signed)
Consult called to [facility] but MD unavalible until late Thursday night. Attending notified.

## 2012-12-27 NOTE — Progress Notes (Signed)
Dr. Laural Benes notified of patients BPs today. Orders given to continue to monitor for now since pain medication was just given. Also, informed MD that patient c/o of severe a pain in R arm with any movement. MD states he will place orders for x-rays. Will continue to monitor.

## 2012-12-27 NOTE — Progress Notes (Signed)
Pt is more awake now and complaining of right shoulder and arm pain.  Will order xrays to rule out frature.  Check CK level in AM.    C. Keerthana Vanrossum. MD

## 2012-12-27 NOTE — Progress Notes (Signed)
Patient progressively waking up. Able to communicate correct year and states that he is hungry hurting in his shoulders. Pain not relieved by tylenol. MD notified.
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
## 2012-12-27 NOTE — ED Notes (Signed)
Called to CT by xr tech, states pt was responding appropriately getting on CT table but could not comprehend getting off table. We moved pt to stretcher and upon transporting him he began seizing full grand mal. Incontinent ua. Returned to ED, [employer] @ bedside,  given Ativan IV with good results
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
## 2012-12-27 NOTE — Progress Notes (Signed)
EEG Completed; Results Pending  

## 2012-12-27 NOTE — Progress Notes (Signed)
Collected urine drug screen for work, lab came to unit to assist with filling out paper work, pt refused to fill out his part/sign it d/t pain in his dominant extremity and said he wasn't going to and to forget about it and he doesn't want to do another drug screen anyway. Will transition for day nurse to follow up with
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
## 2012-12-27 NOTE — Progress Notes (Signed)
Pt appears to be restless, pt in pain, c/o pain 9/10, rt shoulder, acute, constant, ongoing, sharp ache, states current prn pain med is not effective and request something different, will page md to request, vss, low bed, hob self regulated, call bell at pt's side, will cont to monitor

## 2012-12-28 ENCOUNTER — Inpatient Hospital Stay (HOSPITAL_COMMUNITY): Payer: Worker's Compensation

## 2012-12-28 DIAGNOSIS — M6282 Rhabdomyolysis: Secondary | ICD-10-CM | POA: Diagnosis present

## 2012-12-28 DIAGNOSIS — I10 Essential (primary) hypertension: Secondary | ICD-10-CM | POA: Diagnosis present

## 2012-12-28 DIAGNOSIS — I059 Rheumatic mitral valve disease, unspecified: Secondary | ICD-10-CM

## 2012-12-28 LAB — CBC
HCT: 38.6 % — ABNORMAL LOW (ref 39.0–52.0)
MCH: 28.4 pg (ref 26.0–34.0)
MCHC: 33.7 g/dL (ref 30.0–36.0)
MCV: 84.3 fL (ref 78.0–100.0)
Platelets: 270 10*3/uL (ref 150–400)
RDW: 15.8 % — ABNORMAL HIGH (ref 11.5–15.5)

## 2012-12-28 LAB — COMPREHENSIVE METABOLIC PANEL
ALT: 19 U/L (ref 0–53)
AST: 30 U/L (ref 0–37)
Albumin: 3.7 g/dL (ref 3.5–5.2)
Alkaline Phosphatase: 68 U/L (ref 39–117)
BUN: 8 mg/dL (ref 6–23)
Calcium: 9.3 mg/dL (ref 8.4–10.5)
Potassium: 3.6 mEq/L (ref 3.5–5.1)
Sodium: 137 mEq/L (ref 135–145)
Total Protein: 6.7 g/dL (ref 6.0–8.3)

## 2012-12-28 LAB — GLUCOSE, CAPILLARY
Glucose-Capillary: 103 mg/dL — ABNORMAL HIGH (ref 70–99)
Glucose-Capillary: 112 mg/dL — ABNORMAL HIGH (ref 70–99)
Glucose-Capillary: 123 mg/dL — ABNORMAL HIGH (ref 70–99)
Glucose-Capillary: 135 mg/dL — ABNORMAL HIGH (ref 70–99)
Glucose-Capillary: 93 mg/dL (ref 70–99)

## 2012-12-28 LAB — CK: Total CK: 1464 U/L — ABNORMAL HIGH (ref 7–232)

## 2012-12-28 MED ORDER — LISINOPRIL 10 MG PO TABS: 20.0000 mg | ORAL_TABLET | Freq: Every day | ORAL | Status: DC

## 2012-12-28 MED ORDER — IBUPROFEN 400 MG PO TABS
600.0000 mg | ORAL_TABLET | Freq: Three times a day (TID) | ORAL | Status: DC
  Administered 2012-12-28 – 2012-12-29 (×5): 600 mg via ORAL
  Filled 2012-12-28 (×5): qty 2

## 2012-12-28 MED ORDER — PANTOPRAZOLE SODIUM 40 MG PO TBEC
40.0000 mg | DELAYED_RELEASE_TABLET | Freq: Every day | ORAL | Status: DC
  Administered 2012-12-29: 40 mg via ORAL
  Filled 2012-12-28 (×2): qty 1

## 2012-12-28 MED ORDER — LISINOPRIL 10 MG PO TABS
20.0000 mg | ORAL_TABLET | Freq: Every day | ORAL | Status: DC
  Administered 2012-12-28 – 2012-12-29 (×2): 20 mg via ORAL
  Filled 2012-12-28 (×3): qty 2

## 2012-12-28 MED ORDER — HYDRALAZINE HCL 20 MG/ML IJ SOLN
10.0000 mg | INTRAMUSCULAR | Status: DC | PRN
  Administered 2012-12-28: 10 mg via INTRAVENOUS
  Filled 2012-12-28: qty 1
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
## 2012-12-28 NOTE — Progress Notes (Signed)
12/28/12 1015 Patient stated "I want to see that doctor". Agreed to take ibuprofen as ordered. Refused other po medications until he sees the doctor. Text-paged Dr. Laural Benes to notify. Earnstine Regal, RN

## 2012-12-28 NOTE — Progress Notes (Signed)
12/28/12 0854 Notified Dr. Romeo Apple of orthopedic consult. Earnstine Regal, RN

## 2012-12-28 NOTE — Progress Notes (Signed)
UR Chart Review Completed  

## 2012-12-28 NOTE — Progress Notes (Signed)
*  PRELIMINARY RESULTS* Echocardiogram 2D Echocardiogram has been performed.  Victor Palmer 12/28/2012, 9:16 AM

## 2012-12-28 NOTE — Progress Notes (Signed)
12/28/12 1155 Received call back from Dr. Laural Benes. MD spoke with patient via telephone per speakerphone with RN present as well. Updated on plan of care. Discussed ortho consult, neuro consult, and medications ordered with patient. Pt agreed to take keppra and lisinopril as ordered after speaking with MD. Dr. Laural Benes aware CT of shoulder completed per ortho orders and carotid dopplers completed. Results pending. Pt agreeable to have tele-neuro consult obtained today. Notified specialists on call of neuro consult, faxed facesheet as requested. Earnstine Regal, RN
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
## 2012-12-28 NOTE — Progress Notes (Signed)
12/28/12 1025 Patient agreed to have CT scan of right shoulder and carotid dopplers completed this morning. Notified MD paged regarding patient request to speak with him. Pt stated okay. Earnstine Regal, RN
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
## 2012-12-28 NOTE — Progress Notes (Signed)
12/28/12 1320 Dr Laural Benes text-paged again to notify of neuro recommendations for MRI of brain. Order received for MRI of brain without contrast. Notified 2D echo completed and results available. Stated okay. Earnstine Regal, RN

## 2012-12-28 NOTE — Progress Notes (Signed)
12/28/12 1239 Tele neurology consult obtained at 1203. Pt stated okay for aunt and uncle to be present during consult. Dr Lambert Mody stated would like MRI obtained, will fax over recommendations, consult note. Text-paged Dr. Laural Benes to notify or consult completed and recommendation for MRI. Dr. Romeo Apple on floor to see patient for ortho consult at this time. Earnstine Regal, RN

## 2012-12-28 NOTE — Progress Notes (Signed)
12/28/12 1421 Patient agreeable to having urine drug screen completed as requested by employer this afternoon with his wife present. Both agreed to having urine drug screen completed. Unable to obtain last night due to patient refusing to sign form per report from night shift RN. Notified lab of need for paperwork and correct specimen container. Gunnar Fusi in lab stated employer requested urine drug screen must be completed within 24 hours of admission. Unable to collect at this time since over 24 hour limit. Lab stated patient may contact employer to see if they still want to obtain urine drug screen. Discussed with patient, stated okay. Earnstine Regal, RN

## 2012-12-28 NOTE — Progress Notes (Signed)
Pt appears to be resting intermittently, quiet until you make yourself known in the room, VSS, bp down after giving prn hydralazine, constant c/o pain, pt wants doctor to round as early as possible to read xray results to him and tell him what is wrong with his shoulder and what can be done to fix it, the prn pain meds ordered currently are not effectively controlling pt's pain per pt report. Low bed, hob self regulated, call bell at pt's side, will cont to monitor

## 2012-12-29 ENCOUNTER — Encounter (HOSPITAL_COMMUNITY): Payer: Self-pay | Admitting: Radiology

## 2012-12-29 ENCOUNTER — Inpatient Hospital Stay (HOSPITAL_COMMUNITY): Payer: Worker's Compensation

## 2012-12-29 DIAGNOSIS — M25519 Pain in unspecified shoulder: Secondary | ICD-10-CM

## 2012-12-29 LAB — GLUCOSE, CAPILLARY
Glucose-Capillary: 81 mg/dL (ref 70–99)
Glucose-Capillary: 116 mg/dL — ABNORMAL HIGH (ref 70–99)

## 2012-12-29 LAB — COMPREHENSIVE METABOLIC PANEL
AST: 29 U/L (ref 0–37)
Albumin: 3.1 g/dL — ABNORMAL LOW (ref 3.5–5.2)
BUN: 8 mg/dL (ref 6–23)
Creatinine, Ser: 0.98 mg/dL (ref 0.50–1.35)
Sodium: 137 mEq/L (ref 135–145)
Total Bilirubin: 0.3 mg/dL (ref 0.3–1.2)
Total Protein: 5.9 g/dL — ABNORMAL LOW (ref 6.0–8.3)

## 2012-12-29 LAB — CBC WITH DIFFERENTIAL/PLATELET
Basophils Absolute: 0 10*3/uL (ref 0.0–0.1)
Basophils Relative: 0 % (ref 0–1)
Eosinophils Relative: 3 % (ref 0–5)
Lymphocytes Relative: 42 % (ref 12–46)
Lymphs Abs: 3.5 10*3/uL (ref 0.7–4.0)
Monocytes Absolute: 0.6 10*3/uL (ref 0.1–1.0)
Neutrophils Relative %: 48 % (ref 43–77)
Platelets: 234 10*3/uL (ref 150–400)
RBC: 3.98 MIL/uL — ABNORMAL LOW (ref 4.22–5.81)
RDW: 16.2 % — ABNORMAL HIGH (ref 11.5–15.5)
WBC: 8.4 10*3/uL (ref 4.0–10.5)

## 2012-12-29 LAB — CK: Total CK: 965 U/L — ABNORMAL HIGH (ref 7–232)

## 2012-12-29 MED ORDER — LEVETIRACETAM 500 MG PO TABS: 500.0000 mg | ORAL_TABLET | Freq: Two times a day (BID) | ORAL | Status: DC

## 2012-12-29 MED ORDER — HYDROCODONE-ACETAMINOPHEN 5-325 MG PO TABS: 1.0000 | ORAL_TABLET | Freq: Four times a day (QID) | ORAL | Status: DC | PRN
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
## 2012-12-29 NOTE — Progress Notes (Signed)
Patient given discharge instructions with no questions. Reiterated that patient could not drive until released by neurologist. Medications given with no questions. Patient and family unsure if [facility] pharmacy was open to fill prescriptions. This nurse called to check and informed family that they are open until 10pm tonight. Patient ambulated out of facility. Offered wheelchair several times but patient refused. Left facility in stable condition with family.

## 2012-12-29 NOTE — Procedures (Signed)
[facility] Nissi Doffing A. Gerilyn Pilgrim, MD     [email or web address]        NAMEKEMPTON, MILNE                    ACCOUNT NO.:  [id number]  MEDICAL RECORD NO.:  [id number]  LOCATION:                                 FACILITY:  PHYSICIAN:  Muhammad Vacca A. Gerilyn Pilgrim, M.D. DATE OF BIRTH:  [birth year]  DATE OF PROCEDURE:  12/29/2012 DATE OF DISCHARGE:  12/29/2012                             EEG INTERPRETATION   INDICATION:  This is a 52 year old man who presents with new onset seizures.  MEDICATIONS:  Ativan, Keppra, multivitamin, Zofran, NicoDerm patch, folic acid, heparin.  ANALYSIS:  A 16-channel recording using standard 10/20 measurement is conducted for 23 minutes.  The entire recording is done with the patient and stage II sleep, with significant amount of spindles are seen, K complexes are also seen.  Again entire recording is done with the patient and stage II sleep.  There is no focal or lateralized slowing. Photic stimulation is carried out without abnormal changes in the background activity.  There is no epileptiform activity observed.  IMPRESSION:  Normal recording of the sleep state.     Moria Brophy A. Gerilyn Pilgrim, M.D.     KAD/MEDQ  D:  12/29/2012  T:  12/29/2012  Job:  [id number]

## 2012-12-29 NOTE — Progress Notes (Signed)
NUTRITION FOLLOW UP  Intervention:   Continue with current nutrition POC  Nutrition Dx:   Inadequate oral intake related to decreased responsiveness as evidenced by NPO, new onset seizures; resolved.  No new nutrition dx at this time  Goal:   Pt will meet >90% of estimated nutrition needs; goal met  Monitor:   PO intake, labs, skin assessments, weight changes, changes in status  Assessment:   Pt has had no further seizures upon admission; CT and MRI of head and brain WNL. Pt c/o of shoulder pain; awaiting MRI of shoulder. Pt may be discharged today. Pt has been upgraded to a regular diet. Good intake; PO: 100%.  Height: Ht Readings from Last 1 Encounters:  12/27/12 5\' 10"  (1.778 m)    Weight Status:   Wt Readings from Last 1 Encounters:  12/29/12 172 lb 9.9 oz (78.3 kg)    Re-estimated needs:  Kcal: 1500-1600 daily Protein: 58-73 grams daily Fluid: 1.5-1.6 L daily  Skin: Laceration to back of the head  Diet Order: General   Intake/Output Summary (Last 24 hours) at 12/29/12 1320 Last data filed at 12/29/12 1046  Gross per 24 hour  Intake 3199.83 ml  Output   3000 ml  Net 199.83 ml    Last BM: 12/27/12   Labs:   Recent Labs Lab 12/27/12 0454 12/28/12 0438 12/29/12 0450  NA 137 137 137  K 4.1 3.6 3.9  CL 100 101 104  CO2 30 25 27   BUN 17 8 8   CREATININE 1.13 0.99 0.98  CALCIUM 9.9 9.3 9.2  MG 1.9  --   --   PHOS 3.8  --   --   GLUCOSE 100* 101* 96    CBG (last 3)   Recent Labs  12/28/12 2351 12/29/12 0724 12/29/12 1142  GLUCAP 93 81 116*    Scheduled Meds: . folic acid  1 mg Oral Daily  . heparin  5,000 Units Subcutaneous Q8H  . HYDROcodone-acetaminophen  1 tablet Oral Q6H  . ibuprofen  600 mg Oral TID  . levETIRAcetam  500 mg Oral BID  . lisinopril  20 mg Oral Daily  . multivitamin with minerals  1 tablet Oral Daily  . nicotine  14 mg Transdermal Daily  . pantoprazole  40 mg Oral Q0600  . sodium chloride  3 mL Intravenous Q12H   . thiamine  100 mg Oral Daily    Continuous Infusions: . sodium chloride 100 mL/hr at 12/28/12 2234    Jhoselin Crume A. Mayford Knife, RD, LDN Pager: [phone]

## 2014-09-02 ENCOUNTER — Emergency Department (HOSPITAL_COMMUNITY)
Admission: EM | Admit: 2014-09-02 | Discharge: 2014-09-02 | Disposition: A | Discharge status: Home / Self Care | Payer: Non-veteran care | Attending: Emergency Medicine | Admitting: Emergency Medicine

## 2014-09-02 ENCOUNTER — Encounter (HOSPITAL_COMMUNITY): Payer: Self-pay

## 2014-09-02 ENCOUNTER — Emergency Department (HOSPITAL_COMMUNITY): Payer: Non-veteran care

## 2014-09-02 DIAGNOSIS — E119 Type 2 diabetes mellitus without complications: Secondary | ICD-10-CM | POA: Diagnosis not present

## 2014-09-02 DIAGNOSIS — Z79899 Other long term (current) drug therapy: Secondary | ICD-10-CM | POA: Insufficient documentation

## 2014-09-02 DIAGNOSIS — G8929 Other chronic pain: Secondary | ICD-10-CM | POA: Insufficient documentation

## 2014-09-02 DIAGNOSIS — Z8659 Personal history of other mental and behavioral disorders: Secondary | ICD-10-CM | POA: Insufficient documentation

## 2014-09-02 DIAGNOSIS — M545 Low back pain: Secondary | ICD-10-CM | POA: Insufficient documentation

## 2014-09-02 DIAGNOSIS — Z720 Tobacco use: Secondary | ICD-10-CM | POA: Insufficient documentation

## 2014-09-02 DIAGNOSIS — N390 Urinary tract infection, site not specified: Secondary | ICD-10-CM | POA: Diagnosis not present

## 2014-09-02 DIAGNOSIS — M549 Dorsalgia, unspecified: Secondary | ICD-10-CM | POA: Diagnosis present

## 2014-09-02 DIAGNOSIS — R109 Unspecified abdominal pain: Secondary | ICD-10-CM

## 2014-09-02 HISTORY — DX: Other chronic pain: G89.29

## 2014-09-02 HISTORY — DX: Dorsalgia, unspecified: M54.9

## 2014-09-02 LAB — COMPREHENSIVE METABOLIC PANEL
ALT: 24 U/L (ref 17–63)
ANION GAP: 9 (ref 5–15)
AST: 26 U/L (ref 15–41)
Albumin: 4.5 g/dL (ref 3.5–5.0)
Alkaline Phosphatase: 75 U/L (ref 38–126)
BUN: 13 mg/dL (ref 6–20)
CO2: 26 mmol/L (ref 22–32)
Calcium: 9.2 mg/dL (ref 8.9–10.3)
Chloride: 103 mmol/L (ref 101–111)
Creatinine, Ser: 0.92 mg/dL (ref 0.61–1.24)
GFR calc Af Amer: >60 mL/min (ref >60)
GFR calc non Af Amer: >60 mL/min (ref >60)
Glucose, Bld: 94 mg/dL (ref 65–99)
Potassium: 4.1 mmol/L (ref 3.5–5.1)
SODIUM: 138 mmol/L (ref 135–145)
Total Bilirubin: 0.4 mg/dL (ref 0.3–1.2)
Total Protein: 7.6 g/dL (ref 6.5–8.1)

## 2014-09-02 LAB — CBC WITH DIFFERENTIAL/PLATELET
BASOS ABS: 0 10*3/uL (ref 0.0–0.1)
Basophils Relative: 0 % (ref 0–1)
Eosinophils Absolute: 0.3 10*3/uL (ref 0.0–0.7)
Eosinophils Relative: 3 % (ref 0–5)
HCT: 41.3 % (ref 39.0–52.0)
Hemoglobin: 13.9 g/dL (ref 13.0–17.0)
LYMPHS ABS: 4.2 10*3/uL — AB (ref 0.7–4.0)
Lymphocytes Relative: 44 % (ref 12–46)
MCH: 28.5 pg (ref 26.0–34.0)
MCHC: 33.7 g/dL (ref 30.0–36.0)
MCV: 84.6 fL (ref 78.0–100.0)
MONO ABS: 0.6 10*3/uL (ref 0.1–1.0)
MONOS PCT: 6 % (ref 3–12)
NEUTROS ABS: 4.4 10*3/uL (ref 1.7–7.7)
Neutrophils Relative %: 47 % (ref 43–77)
Platelets: 254 10*3/uL (ref 150–400)
RBC: 4.88 MIL/uL (ref 4.22–5.81)
RDW: 15.2 % (ref 11.5–15.5)
WBC: 9.5 10*3/uL (ref 4.0–10.5)

## 2014-09-02 LAB — URINALYSIS, ROUTINE W REFLEX MICROSCOPIC
BILIRUBIN URINE: NEGATIVE
Glucose, UA: NEGATIVE mg/dL
Ketones, ur: NEGATIVE mg/dL
Nitrite: NEGATIVE
PROTEIN: NEGATIVE mg/dL
Specific Gravity, Urine: 1.02 (ref 1.005–1.030)
Urobilinogen, UA: 0.2 mg/dL (ref 0.0–1.0)
pH: 7 (ref 5.0–8.0)

## 2014-09-02 LAB — URINE MICROSCOPIC-ADD ON

## 2014-09-02 MED ORDER — CEPHALEXIN 500 MG PO CAPS: 500.0000 mg | ORAL_CAPSULE | Freq: Four times a day (QID) | ORAL | Status: DC

## 2014-09-02 MED ORDER — ONDANSETRON HCL 4 MG/2ML IJ SOLN
4.0000 mg | Freq: Once | INTRAMUSCULAR | Status: AC
  Administered 2014-09-02: 4 mg via INTRAVENOUS
  Filled 2014-09-02: qty 2

## 2014-09-02 MED ORDER — MORPHINE SULFATE 4 MG/ML IJ SOLN
4.0000 mg | Freq: Once | INTRAMUSCULAR | Status: AC
  Administered 2014-09-02: 4 mg via INTRAVENOUS
  Filled 2014-09-02: qty 1

## 2014-09-02 MED ORDER — METHOCARBAMOL 500 MG PO TABS
500.0000 mg | ORAL_TABLET | Freq: Once | ORAL | Status: AC
  Administered 2014-09-02: 500 mg via ORAL
  Filled 2014-09-02: qty 1

## 2014-09-02 MED ORDER — HYDROCODONE-ACETAMINOPHEN 7.5-325 MG PO TABS: 1.0000 | ORAL_TABLET | Freq: Four times a day (QID) | ORAL | Status: DC | PRN

## 2014-09-02 NOTE — ED Notes (Signed)
Complain of pain in left side and back. States he has chronic back pain from accidents. States pain has been worse for a week. Pt has tenns unit in place.

## 2014-09-02 NOTE — Discharge Instructions (Signed)
Flank Pain Flank pain is pain in your side. The flank is the area of your side between your upper belly (abdomen) and your back. Pain in this area can be caused by many different things. HOME CARE Home care and treatment will depend on the cause of your pain.  Rest as told by your doctor.  Drink enough fluids to keep your pee (urine) clear or pale yellow.  Only take medicine as told by your doctor.  Tell your doctor about any changes in your pain.  Follow up with your doctor. GET HELP RIGHT AWAY IF:   Your pain does not get better with medicine.   You have new symptoms or your symptoms get worse.  Your pain gets worse.   You have belly (abdominal) pain.   You are short of breath.   You always feel sick to your stomach (nauseous).   You keep throwing up (vomiting).   You have puffiness (swelling) in your belly.   You feel light-headed or you pass out (faint).   You have blood in your pee.  You have a fever or lasting symptoms for more than 2-3 days.  You have a fever and your symptoms suddenly get worse. MAKE SURE YOU:   Understand these instructions.  Will watch your condition.  Will get help right away if you are not doing well or get worse. Document Released: 11/11/2007 Document Revised: 06/18/2013 Document Reviewed: 09/16/2011 [facility] Patient Information 2015 [place], [place]. This information is not intended to replace advice given to you by your health care provider. Make sure you discuss any questions you have with your health care provider.  Flank Pain Flank pain is pain in your side. The flank is the area of your side between your upper belly (abdomen) and your back. Pain in this area can be caused by many different things. HOME CARE Home care and treatment will depend on the cause of your pain.  Rest as told by your doctor.  Drink enough fluids to keep your pee (urine) clear or pale yellow.  Only take medicine as told by your  doctor.  Tell your doctor about any changes in your pain.  Follow up with your doctor. GET HELP RIGHT AWAY IF:   Your pain does not get better with medicine.   You have new symptoms or your symptoms get worse.  Your pain gets worse.   You have belly (abdominal) pain.   You are short of breath.   You always feel sick to your stomach (nauseous).   You keep throwing up (vomiting).   You have puffiness (swelling) in your belly.   You feel light-headed or you pass out (faint).   You have blood in your pee.  You have a fever or lasting symptoms for more than 2-3 days.  You have a fever and your symptoms suddenly get worse. MAKE SURE YOU:   Understand these instructions.  Will watch your condition.  Will get help right away if you are not doing well or get worse. Document Released: 11/11/2007 Document Revised: 06/18/2013 Document Reviewed: 09/16/2011 [facility] Patient Information 2015 [place], [place]. This information is not intended to replace advice given to you by your health care provider. Make sure you discuss any questions you have with your health care provider.

## 2014-09-02 NOTE — ED Provider Notes (Signed)
CSN: [id number]     Arrival date & time 09/02/14  0744 History   First MD Initiated Contact with Patient 09/02/14 [phone]     Chief Complaint  Patient presents with  . Back Pain     (Consider location/radiation/quality/duration/timing/severity/associated sxs/prior Treatment) HPI   Victor Palmer is a 54 y.o. male who presents to the Emergency Department complaining of gradual onset of the left flank pain for one week.  He describes a sharp, stabbing pain localized to the flank.  Pain is worse with movement, especially twisting.  Pain resolves at rest.  He has a hx of chronic low back pain, but states this pain is different.  He has tried applying a TENS unit, ice and heat without relief.  He states pain does not radiate.  He also reports frequent urination and bowel movements shortly after eating.  Stools were dark in color last week.  He denies nausea, vomiting, pain to his abdomen or lower extremities, fever or chills.     Past Medical History  Diagnosis Date  . Bipolar 1 disorder   . Type II or unspecified type diabetes mellitus without mention of complication, not stated as uncontrolled   . Chronic back pain    History reviewed. No pertinent past surgical history. Family History  Problem Relation Age of Onset  . Hypertension    . Seizures Mother     started at age 54    History  Substance Use Topics  . Smoking status: Current Every Day Smoker    Types: Cigarettes  . Smokeless tobacco: Not on file  . Alcohol Use: Yes     Comment: daily    Review of Systems  Constitutional: Negative for fever.  Respiratory: Negative for chest tightness and shortness of breath.   Cardiovascular: Negative for chest pain.  Gastrointestinal: Negative for nausea, vomiting, abdominal pain, diarrhea and constipation.  Genitourinary: Positive for flank pain. Negative for dysuria, frequency, hematuria, decreased urine volume and difficulty urinating.  Musculoskeletal: Negative for back pain and joint  swelling.  Skin: Negative for rash.  Neurological: Negative for weakness and numbness.  All other systems reviewed and are negative.     Allergies  Contrast media  Home Medications   Prior to Admission medications   Medication Sig Start Date End Date Taking? Authorizing Provider  HYDROcodone-acetaminophen (NORCO/VICODIN) 5-325 MG per tablet Take 1 tablet by mouth every 6 (six) hours as needed for moderate pain. 12/29/12   Standley Brooking, MD  levETIRAcetam (KEPPRA) 500 MG tablet Take 1 tablet (500 mg total) by mouth 2 (two) times daily. 12/29/12   Standley Brooking, MD   BP 142/95 mmHg  Pulse 72  Temp(Src) 97.8 F (36.6 C) (Oral)  Resp 24  SpO2 100% Physical Exam  Constitutional: He is oriented to person, place, and time. He appears well-developed and well-nourished. No distress.  HENT:  Head: Normocephalic and atraumatic.  Neck: Normal range of motion. Neck supple.  Cardiovascular: Normal rate, regular rhythm, normal heart sounds and intact distal pulses.   No murmur heard. Pulmonary/Chest: Effort normal and breath sounds normal. No respiratory distress. He exhibits no tenderness.  Abdominal: Soft. He exhibits no distension and no mass. There is no tenderness. There is no rebound, no guarding and no CVA tenderness.  Musculoskeletal: He exhibits tenderness. He exhibits no edema.       Lumbar back: He exhibits tenderness and pain. He exhibits normal range of motion, no swelling, no deformity, no laceration and normal pulse.  Localized  ttp of the left flank.  Reproduced with palpation and movement. No spinal tenderness.  DP pulses are brisk and symmetrical.  Distal sensation intact.  Hip Flexors/Extensors are intact.  Pt has 5/5 strength against resistance of bilateral lower extremities.     Neurological: He is alert and oriented to person, place, and time. He has normal strength. No sensory deficit. He exhibits normal muscle tone. Coordination and gait normal.  Reflex Scores:       Patellar reflexes are 2+ on the right side and 2+ on the left side.      Achilles reflexes are 2+ on the right side and 2+ on the left side. Skin: Skin is warm and dry. No rash noted.  Nursing note and vitals reviewed.   ED Course  Procedures (including critical care time) Labs Review Labs Reviewed  URINALYSIS, ROUTINE W REFLEX MICROSCOPIC (NOT [facility]) - Abnormal; Notable for the following:    Hgb urine dipstick MODERATE (*)    Leukocytes, UA TRACE (*)    All other components within normal limits  CBC WITH DIFFERENTIAL/PLATELET - Abnormal; Notable for the following:    Lymphs Abs 4.2 (*)    All other components within normal limits  URINE MICROSCOPIC-ADD ON - Abnormal; Notable for the following:    Bacteria, UA MANY (*)    All other components within normal limits  URINE CULTURE  COMPREHENSIVE METABOLIC PANEL    Imaging Review Ct Renal Stone Study  09/02/2014   CLINICAL DATA:  Left side and back pain, chronic but worsening over the last week. Left flank pain.  EXAM: CT ABDOMEN AND PELVIS WITHOUT CONTRAST  TECHNIQUE: Multidetector CT imaging of the abdomen and pelvis was performed following the standard protocol without IV contrast.  COMPARISON:  None.  FINDINGS: Dependent atelectasis in the lung bases. No effusions. Heart is normal size.  Liver, spleen, gallbladder, pancreas, adrenals and kidneys have an unremarkable unenhanced appearance. No renal or ureteral stones. Urinary bladder is decompressed, grossly unremarkable. No hydronephrosis.  Stomach, large and small bowel unremarkable. Aorta is normal caliber with scattered calcifications in the aorta and iliac vessels. No free fluid, free air or adenopathy.  No acute bony abnormality or focal bone lesion.  IMPRESSION: No renal or ureteral stones.  No hydronephrosis.  No acute findings.  Aortoiliac atherosclerosis.   Electronically Signed   By: Charlett NoseKevin  Dover M.D.   On: 09/02/2014 10:32     EKG Interpretation None       Urine  culture pending.    MDM   Final diagnoses:  Left flank pain  UTI (lower urinary tract infection)    On further history, pt reports chronic hematuria for ~2 years, unknown cause.    Pt is resting comfortably, pain improved after medication. Urine cultured, I will tx for UTI .  He agrees to antibiotic , pain medications and urology f/u.  Given return precautions as well.  ambulatory and appears stable for d/c     Pauline Ausammy Saydie Gerdts, PA-C 09/02/14 1632  Vanetta MuldersScott Zackowski, MD 09/04/14 [phone]

## 2014-09-04 LAB — URINE CULTURE: CULTURE: NO GROWTH

## 2016-10-22 ENCOUNTER — Emergency Department (HOSPITAL_COMMUNITY)
Admission: EM | Admit: 2016-10-22 | Discharge: 2016-10-22 | Disposition: A | Discharge status: Home / Self Care | Payer: Non-veteran care | Attending: Emergency Medicine | Admitting: Emergency Medicine

## 2016-10-22 ENCOUNTER — Emergency Department (HOSPITAL_COMMUNITY): Payer: Non-veteran care

## 2016-10-22 ENCOUNTER — Encounter (HOSPITAL_COMMUNITY): Payer: Self-pay | Admitting: Emergency Medicine

## 2016-10-22 DIAGNOSIS — E119 Type 2 diabetes mellitus without complications: Secondary | ICD-10-CM | POA: Insufficient documentation

## 2016-10-22 DIAGNOSIS — R202 Paresthesia of skin: Secondary | ICD-10-CM | POA: Diagnosis not present

## 2016-10-22 DIAGNOSIS — F17210 Nicotine dependence, cigarettes, uncomplicated: Secondary | ICD-10-CM | POA: Insufficient documentation

## 2016-10-22 DIAGNOSIS — I10 Essential (primary) hypertension: Secondary | ICD-10-CM | POA: Insufficient documentation

## 2016-10-22 DIAGNOSIS — R200 Anesthesia of skin: Secondary | ICD-10-CM | POA: Diagnosis not present

## 2016-10-22 DIAGNOSIS — R079 Chest pain, unspecified: Secondary | ICD-10-CM | POA: Diagnosis present

## 2016-10-22 HISTORY — DX: Essential (primary) hypertension: I10

## 2016-10-22 HISTORY — DX: Post-traumatic stress disorder, unspecified: F43.10

## 2016-10-22 HISTORY — DX: Pure hypercholesterolemia, unspecified: E78.00

## 2016-10-22 LAB — CBC
HEMATOCRIT: 39.2 % (ref 39.0–52.0)
HEMOGLOBIN: 13.2 g/dL (ref 13.0–17.0)
MCH: 29 pg (ref 26.0–34.0)
MCHC: 33.7 g/dL (ref 30.0–36.0)
MCV: 86.2 fL (ref 78.0–100.0)
Platelets: 269 10*3/uL (ref 150–400)
RBC: 4.55 MIL/uL (ref 4.22–5.81)
RDW: 16.1 % — AB (ref 11.5–15.5)
WBC: 10.5 10*3/uL (ref 4.0–10.5)

## 2016-10-22 LAB — BASIC METABOLIC PANEL
ANION GAP: 5 (ref 5–15)
BUN: 18 mg/dL (ref 6–20)
CO2: 27 mmol/L (ref 22–32)
Calcium: 8.9 mg/dL (ref 8.9–10.3)
Chloride: 106 mmol/L (ref 101–111)
Creatinine, Ser: 1.15 mg/dL (ref 0.61–1.24)
GFR calc Af Amer: >60 mL/min (ref >60)
GLUCOSE: 111 mg/dL — AB (ref 65–99)
POTASSIUM: 3.7 mmol/L (ref 3.5–5.1)
Sodium: 138 mmol/L (ref 135–145)

## 2016-10-22 LAB — I-STAT TROPONIN, ED: Troponin i, poc: 0.01 ng/mL (ref 0.00–0.08)

## 2016-10-22 MED ORDER — DEXAMETHASONE SODIUM PHOSPHATE 10 MG/ML IJ SOLN
10.0000 mg | Freq: Once | INTRAMUSCULAR | Status: AC
  Administered 2016-10-22: 10 mg via INTRAVENOUS
  Filled 2016-10-22: qty 1

## 2016-10-22 MED ORDER — NAPROXEN 500 MG PO TABS: 500.0000 mg | ORAL_TABLET | Freq: Two times a day (BID) | ORAL | 0 refills | Status: DC

## 2016-10-22 MED ORDER — DEXAMETHASONE SODIUM PHOSPHATE 10 MG/ML IJ SOLN: 10.0000 mg | Freq: Once | INTRAMUSCULAR | Status: DC

## 2016-10-22 NOTE — ED Notes (Signed)
Pt reports R arm numbness for the last 3-4 weeks  Using any free drug he can get including cocaine  [facility]  Reports he was a [employer]  Actions of cocaine on vessels and effect on the heart education

## 2016-10-22 NOTE — ED Notes (Signed)
Dr Miller in to assess 

## 2016-10-22 NOTE — ED Notes (Signed)
Pt resting.

## 2016-10-22 NOTE — ED Provider Notes (Signed)
[facility]-EMER[facility]ENCY DEPT Provider Note   CSN: [id number] Arrival date & time: 10/22/16  1212     History   Chief Complaint Chief Complaint  Patient presents with  . Chest Pain    HPI  Victor Palmer is a 56 y.o. Male with a history of diabetes, hyperlipidemia, hypertension who presents complaining of intermittent that starts on the right outer chest wall and radiates down the right arm with numbness and tingling. [facility] and was told to go to the ED, but refused transport and left AMA. Since then pain has become progressively worse and more frequent, patient reports numbness and tingling present in right arm every 3-4 minutes, worse with raising the right arm, relieved by shaking arm. Pain is not exertional or positional. Patient uses chronic opioids at home and reports these have not helped with the pain. Denies associated shortness of breath, nausea, vomiting, abdominal pain, lower extremity swelling. Denies radiation to the back. Patient denies personal or family history of cardiac issues, is a current smoker and uses cocaine.       Past Medical History:  Diagnosis Date  . Bipolar 1 disorder (HCC)   . Chronic back pain   . High cholesterol   . Hypertension   . PTSD (post-traumatic stress disorder)   . Type II or unspecified type diabetes mellitus without mention of complication, not stated as uncontrolled     Patient Active Problem List   Diagnosis Date Noted  . Unspecified essential hypertension 12/28/2012  . Rhabdomyolysis 12/28/2012  . Seizure (HCC) 12/27/2012  . Head injury 12/27/2012  . New onset seizure (HCC) 12/27/2012  . History of alcohol use 12/27/2012  . Scalp abrasion 12/27/2012  . Head injury, unspecified 12/27/2012  . Bipolar 1 disorder (HCC)   . Type II or unspecified type diabetes mellitus without mention of complication, not stated as uncontrolled     History reviewed. No pertinent surgical  history.     Home Medications    Prior to Admission medications   Medication Sig Start Date End Date Taking? Authorizing Provider  cephALEXin (KEFLEX) 500 MG capsule Take 1 capsule (500 mg total) by mouth 4 (four) times daily. For 7 days 09/02/14   Pauline Ausriplett, Tammy, PA-C  HYDROcodone-acetaminophen (NORCO) 7.5-325 MG per tablet Take 1 tablet by mouth every 6 (six) hours as needed for moderate pain. 09/02/14   Triplett, Tammy, PA-C  levETIRAcetam (KEPPRA) 500 MG tablet Take 1 tablet (500 mg total) by mouth 2 (two) times daily. 12/29/12   Standley BrookingGoodrich, Daniel P, MD    Family History Family History  Problem Relation Age of Onset  . Hypertension Unknown   . Seizures Mother        started at age 56     Social History Social History  Substance Use Topics  . Smoking status: Current Every Day Smoker    Types: Cigarettes  . Smokeless tobacco: Not on file  . Alcohol use Yes     Comment: daily     Allergies   Contrast media [iodinated diagnostic agents]   Review of Systems Review of Systems  Constitutional: Negative for chills and fever.  HENT: Negative for ear pain, rhinorrhea and sore throat.   Eyes: Negative for photophobia and visual disturbance.  Respiratory: Negative for cough, chest tightness and shortness of breath.   Cardiovascular: Positive for chest pain. Negative for palpitations and leg swelling.  Gastrointestinal: Negative for abdominal pain, nausea and  vomiting.  Genitourinary: Negative for difficulty urinating, dysuria and hematuria.  Musculoskeletal: Positive for arthralgias and back pain.       Back pain is chronic in nature  Skin: Negative for color change and rash.  Neurological: Positive for numbness. Negative for dizziness, seizures, syncope, facial asymmetry, speech difficulty, weakness and headaches.  All other systems reviewed and are negative.    Physical Exam Updated Vital Signs BP (!) 154/100 (BP Location: Left Arm)   Pulse 87   Temp 98.3 F (36.8  C) (Oral)   Resp 16   Ht  (1.778 m)   Wt 75.3 kg (166 lb)   SpO2 99%   BMI 23.82 kg/m   Physical Exam  Constitutional: He appears well-developed and well-nourished. No distress.  HENT:  Head: Normocephalic and atraumatic.  Eyes: Pupils are equal, round, and reactive to light. EOM are normal. Right eye exhibits no discharge. Left eye exhibits no discharge.  Neck: Neck supple. No JVD present.  Cardiovascular: Normal rate, regular rhythm, normal heart sounds and intact distal pulses.  Exam reveals no gallop and no friction rub.   No murmur heard. Pulmonary/Chest: Effort normal and breath sounds normal. No respiratory distress. He exhibits tenderness.  Tenderness of outer portion of right chest wall reproducible on palpation  Abdominal: Soft. Bowel sounds are normal. There is no tenderness. There is no guarding.  Musculoskeletal:  Strength at the level of the wrist elbow and shoulder is intact bilaterally, patient able to complete full range of motion of right arm with some pain, distal pulses and sensation are intact, no discoloration of right arm  Neurological: He is alert. Coordination normal.  Speech is clear, able to follow commands CN III-XII intact Normal strength in upper and lower extremities bilaterally including dorsiflexion and plantar flexion, strong and equal grip strength Sensation normal to light and sharp touch Moves extremities without ataxia, coordination intact    Skin: Skin is warm and dry. Capillary refill takes less than 2 seconds. No rash noted. He is not diaphoretic. No erythema.  Psychiatric: He has a normal mood and affect. His behavior is normal.  Nursing note and vitals reviewed.    ED Treatments / Results  Labs (all labs ordered are listed, but only abnormal results are displayed) Labs Reviewed  BASIC METABOLIC PANEL - Abnormal; Notable for the following:       Result Value   Glucose, Bld 111 (*)    All other components within normal limits    CBC - Abnormal; Notable for the following:    RDW 16.1 (*)    All other components within normal limits  I-STAT TROPONIN, ED    EKG  EKG Interpretation  Date/Time:  Friday October 22 2016 12:18:23 EDT Ventricular Rate:  86 PR Interval:  134 QRS Duration: 94 QT Interval:  378 QTC Calculation: 452 R Axis:   60 Text Interpretation:  Sinus rhythm with frequent Premature ventricular complexes Possible Left atrial enlargement Left ventricular hypertrophy Nonspecific ST and T wave abnormality Abnormal ECG slt from 12/27/12, no significant changes seen. Confirmed by Eber Hong ([id number]) on 10/22/2016 12:28:17 PM       Radiology Dg Chest 2 View  Result Date: 10/22/2016 CLINICAL DATA:  Numbness of the right hand and shoulder, 3-4 weeks duration. EXAM: CHEST  2 VIEW COMPARISON:  12/27/2012 FINDINGS: Heart size is normal. Somewhat tortuous aorta. Lungs are clear. The pulmonary vascularity is normal. No effusions. No abnormal bone finding. IMPRESSION: No active disease.  Somewhat tortuous aorta. Electronically  Signed   By: Paulina Fusi M.D.   On: 10/22/2016 12:58    Procedures Procedures (including critical care time)  Medications Ordered in ED Medications  dexamethasone (DECADRON) injection 10 mg (10 mg Intravenous Given 10/22/16 1455)     Initial Impression / Assessment and Plan / ED Course  I have reviewed the triage vital signs and the nursing notes.  Pertinent labs & imaging results that were available during my care of the patient were reviewed by me and considered in my medical decision making (see chart for details).  Patient presents with pain radiating down the right arm with minimal chest involvement, vitals are normal. Pain very unlikely to be cardiac in nature, labs and EKG are unremarkable. Pain is not exertional or positional. Most likely pain is neurologic. Discussed with patient the use of decadron with diabetes and he is aware of risk and agrees with treatment. Patient  to follow-up with Dr. Wynetta Emery with neurosurgery [facility]. Return precautions provided, patient ex[resses understanding and is in agreement with plan.  Patient discussed with Dr. Hyacinth Meeker, who saw patient as well and agrees with plan.   Final Clinical Impressions(s) / ED Diagnoses   Final diagnoses:  Numbness and tingling of right arm    New Prescriptions Discharge Medication List as of 10/22/2016  2:31 PM    START taking these medications   Details  naproxen (NAPROSYN) 500 MG tablet Take 1 tablet (500 mg total) by mouth 2 (two) times daily., Starting Fri 10/22/2016, Print         Dartha Lodge, PA-C 10/22/16 1558    Eber Hong, MD 10/23/16 [phone]

## 2016-10-22 NOTE — ED Provider Notes (Signed)
Medical screening examination/treatment/procedure(s) were conducted as a shared visit with non-physician practitioner(s) and myself.  I personally evaluated the patient during the encounter.  Clinical Impression:   Final diagnoses:  Numbness and tingling of right arm    The patient is a 56 year old male, he has a diet controlled diabetic, he has a history of right arm and shoulder discomfort which has been going on for the last couple of weeks, he reports that there is a small area of the right upper outer chest wall which is also involved, he seems to have this come on randomly, it is not exertional or positional and is a tingling and pins and needle sensation which goes down his arm towards his hand, he is not more specific about location despite being prodded on being more exact. He has total normal range of motion of the right upper extremity at this time with no symptoms, he has normal strength at the shoulder, rotator cuff, biceps, triceps, forearm. He has normal flexor and extensor function at the hand and his radial, median and ulnar nerves are functioning in a normal pattern. His sensation is diffusely normal, his pulses are normal at the wrist and is coloring of the skin is normal as well. Heart exam is unremarkable with no tachycardia or murmurs.  Labs reviewed, EKG reviewed, no signs of cardiac etiology, suspect neurologic. Discussed with the patient regarding using Decadron is a diabetic, he is willing to accept the risks, is close follow-up, we'll give referral to neurosurgery.     Eber HongMiller, Latangela Mccomas, MD 10/23/16 [phone]

## 2016-10-22 NOTE — ED Notes (Signed)
To radiology

## 2016-10-22 NOTE — Discharge Instructions (Signed)
Your symptoms are likely a neurologic issue, the steroid shot you received in the emergency department should last a few days. You may use naprosyn at home for pain relief. Follow up with neurosurgery as directed. If you develop new or concerning symptoms, or develop persistent chest pain please return to the emergency department for further evaluation. Follow-up with your primary doctor as well.

## 2016-10-22 NOTE — ED Notes (Signed)
PA in to assess 

## 2016-10-22 NOTE — ED Triage Notes (Addendum)
Patient c/o intermittent right chest pain that radiates into right arm with numbness and tingling. Patient seen in [place] 2 weeks ago by PCP and told to go to ER. Patient shrugged it off but pain is becoming progressively worse and more frequent. Denies any cardiac hx.

## 2017-02-16 ENCOUNTER — Encounter: Payer: Self-pay | Admitting: Internal Medicine

## 2017-02-28 ENCOUNTER — Encounter: Payer: Self-pay | Admitting: Internal Medicine

## 2017-04-02 ENCOUNTER — Encounter (HOSPITAL_COMMUNITY): Payer: Self-pay | Admitting: Emergency Medicine

## 2017-04-02 ENCOUNTER — Emergency Department (HOSPITAL_COMMUNITY)
Admission: EM | Admit: 2017-04-02 | Discharge: 2017-04-02 | Disposition: A | Discharge status: Home / Self Care | Payer: Non-veteran care | Attending: Emergency Medicine | Admitting: Emergency Medicine

## 2017-04-02 ENCOUNTER — Emergency Department (HOSPITAL_COMMUNITY): Payer: Non-veteran care

## 2017-04-02 ENCOUNTER — Other Ambulatory Visit: Payer: Self-pay

## 2017-04-02 DIAGNOSIS — E119 Type 2 diabetes mellitus without complications: Secondary | ICD-10-CM | POA: Insufficient documentation

## 2017-04-02 DIAGNOSIS — S92325A Nondisplaced fracture of second metatarsal bone, left foot, initial encounter for closed fracture: Secondary | ICD-10-CM | POA: Diagnosis not present

## 2017-04-02 DIAGNOSIS — F17210 Nicotine dependence, cigarettes, uncomplicated: Secondary | ICD-10-CM | POA: Diagnosis not present

## 2017-04-02 DIAGNOSIS — W0110XA Fall on same level from slipping, tripping and stumbling with subsequent striking against unspecified object, initial encounter: Secondary | ICD-10-CM | POA: Insufficient documentation

## 2017-04-02 DIAGNOSIS — I10 Essential (primary) hypertension: Secondary | ICD-10-CM | POA: Diagnosis not present

## 2017-04-02 DIAGNOSIS — Y929 Unspecified place or not applicable: Secondary | ICD-10-CM | POA: Insufficient documentation

## 2017-04-02 DIAGNOSIS — Y9301 Activity, walking, marching and hiking: Secondary | ICD-10-CM | POA: Insufficient documentation

## 2017-04-02 DIAGNOSIS — Y999 Unspecified external cause status: Secondary | ICD-10-CM | POA: Insufficient documentation

## 2017-04-02 DIAGNOSIS — Z79899 Other long term (current) drug therapy: Secondary | ICD-10-CM | POA: Diagnosis not present

## 2017-04-02 DIAGNOSIS — S99912A Unspecified injury of left ankle, initial encounter: Secondary | ICD-10-CM | POA: Diagnosis present

## 2017-04-02 LAB — CBG MONITORING, ED: GLUCOSE-CAPILLARY: 104 mg/dL — AB (ref 65–99)

## 2017-04-02 MED ORDER — OXYCODONE-ACETAMINOPHEN 5-325 MG PO TABS
2.0000 | ORAL_TABLET | Freq: Once | ORAL | Status: AC
  Administered 2017-04-02: 2 via ORAL
  Filled 2017-04-02: qty 2

## 2017-04-02 NOTE — ED Triage Notes (Signed)
Pt fell last night getting up to bathroom, got dizzy which he does sometimes with his night time meds. Pt complaining of left foot/ankle pain. Unable to bear weight.

## 2017-04-02 NOTE — Discharge Instructions (Signed)
Keep foot elevated and use cold therapy. Do not put weight on foot Call Dr. Mort SawyersHarrison's office Monday morning for recheck Use ibuprofen 400 mg every 6-8 hours as needed for pain

## 2017-04-02 NOTE — ED Provider Notes (Signed)
[facility] EMERGENCY DEPARTMENT Provider Note   CSN: [id number] Arrival date & time: 04/02/17  [id number]     History   Chief Complaint Chief Complaint  Patient presents with  . Ankle Pain    HPI Yash Sarita Haver Cales is a 57 y.o. male.  HPI  57 year old male history of bipolar disorder, chronic back pain, hypertension, high cholesterol, type 2 diabetes presents today after feeling lightheaded and stumbling and injuring left foot.  He states he often feels lightheaded after taking his medications.  He denies any other injury.  Is complaining of pain to his left foot.  There is no neck pain, or head pain.  He feels back to baseline.  Past Medical History:  Diagnosis Date  . Bipolar 1 disorder (HCC)   . Chronic back pain   . High cholesterol   . Hypertension   . PTSD (post-traumatic stress disorder)   . Type II or unspecified type diabetes mellitus without mention of complication, not stated as uncontrolled     Patient Active Problem List   Diagnosis Date Noted  . Unspecified essential hypertension 12/28/2012  . Rhabdomyolysis 12/28/2012  . Seizure (HCC) 12/27/2012  . Head injury 12/27/2012  . New onset seizure (HCC) 12/27/2012  . History of alcohol use 12/27/2012  . Scalp abrasion 12/27/2012  . Head injury, unspecified 12/27/2012  . Bipolar 1 disorder (HCC)   . Type II or unspecified type diabetes mellitus without mention of complication, not stated as uncontrolled     History reviewed. No pertinent surgical history.     Home Medications    Prior to Admission medications   Medication Sig Start Date End Date Taking? Authorizing Provider  cephALEXin (KEFLEX) 500 MG capsule Take 1 capsule (500 mg total) by mouth 4 (four) times daily. For 7 days Patient not taking: Reported on 10/22/2016 09/02/14   Pauline Ausriplett, Tammy, PA-C  HYDROcodone-acetaminophen (NORCO) 7.5-325 MG per tablet Take 1 tablet by mouth every 6 (six) hours as needed for moderate pain. Patient not taking: Reported on  10/22/2016 09/02/14   Triplett, Tammy, PA-C  levETIRAcetam (KEPPRA) 500 MG tablet Take 1 tablet (500 mg total) by mouth 2 (two) times daily. 12/29/12   Standley BrookingGoodrich, Daniel P, MD  naproxen (NAPROSYN) 500 MG tablet Take 1 tablet (500 mg total) by mouth 2 (two) times daily. 10/22/16   Dartha LodgeFord, Kelsey N, PA-C    Family History Family History  Problem Relation Age of Onset  . Hypertension Unknown   . Seizures Mother        started at age 57     Social History Social History   Tobacco Use  . Smoking status: Current Every Day Smoker    Types: Cigarettes  Substance Use Topics  . Alcohol use: Yes    Comment: daily  . Drug use: No     Allergies   Contrast media [iodinated diagnostic agents]   Review of Systems Review of Systems  All other systems reviewed and are negative.    Physical Exam Updated Vital Signs BP (!) 169/111   Pulse 95   Temp 99.1 F (37.3 C) (Oral)   Resp 18   SpO2 98%   Physical Exam  Constitutional: He appears well-developed and well-nourished.  HENT:  Head: Normocephalic.  Musculoskeletal: Normal range of motion.       Feet:  Tenderness to palpation over dorsal aspect of left foot.  Toes are nontender.  Ankle is nontender.  Left lower extremity above ankle is nontender to palpation.  Full active  range of motion is present.  No external signs of deformity or trauma or discoloration are noted.  Dorsal pedals pulses is intact.  Toes are pink and capillary refill is less than 2 seconds.  Nursing note and vitals reviewed.    ED Treatments / Results  Labs (all labs ordered are listed, but only abnormal results are displayed) Labs Reviewed - No data to display  EKG  EKG Interpretation None       Radiology Dg Ankle 2 Views Left  Result Date: 04/02/2017 CLINICAL DATA:  Fall trying to get into bed last night with left foot and ankle pain. EXAM: LEFT ANKLE - 2 VIEW COMPARISON:  None. FINDINGS: There is no evidence of fracture, dislocation, or joint  effusion. There is no evidence of arthropathy or other focal bone abnormality. Soft tissues are unremarkable. IMPRESSION: Negative. Electronically Signed   By: Elberta Fortis M.D.   On: 04/02/2017 09:52   Dg Foot Complete Left  Result Date: 04/02/2017 CLINICAL DATA:  Fall last night trying to get into bed with left ankle pain. EXAM: LEFT FOOT - COMPLETE 3+ VIEW COMPARISON:  None. FINDINGS: Examination demonstrates a transverse fracture through the base of the second metatarsal with very minimal displacement. Remainder the exam is unremarkable. IMPRESSION: Transverse fracture base of the second metatarsal with very minimal displacement. Electronically Signed   By: Elberta Fortis M.D.   On: 04/02/2017 09:51  x-Sammantha Mehlhaff reviewed and discussed with patient Margarita Grizzle   Procedures Procedures (including critical care time)  Medications Ordered in ED Medications - No data to display   Initial Impression / Assessment and Plan / ED Course  I have reviewed the triage vital signs and the nursing notes.  Pertinent labs & imaging results that were available during my care of the patient were reviewed by me and considered in my medical decision making (see chart for details).     Posterior splint Crutches Non weight bearing F/u Dr. Romeo Apple  Final Clinical Impressions(s) / ED Diagnoses   Final diagnoses:  Closed nondisplaced fracture of second metatarsal bone of left foot, initial encounter    ED Discharge Orders    None       Margarita Grizzle, MD 04/02/17 [phone]

## 2017-04-08 ENCOUNTER — Ambulatory Visit: Payer: Self-pay | Admitting: Gastroenterology

## 2017-04-18 ENCOUNTER — Telehealth: Payer: Self-pay

## 2017-04-18 ENCOUNTER — Encounter: Payer: Self-pay | Admitting: Gastroenterology

## 2017-04-18 ENCOUNTER — Ambulatory Visit (INDEPENDENT_AMBULATORY_CARE_PROVIDER_SITE_OTHER): Payer: Non-veteran care | Admitting: Gastroenterology

## 2017-04-18 DIAGNOSIS — K625 Hemorrhage of anus and rectum: Secondary | ICD-10-CM | POA: Diagnosis not present

## 2017-04-18 DIAGNOSIS — K219 Gastro-esophageal reflux disease without esophagitis: Secondary | ICD-10-CM

## 2017-04-18 DIAGNOSIS — R630 Anorexia: Secondary | ICD-10-CM | POA: Insufficient documentation

## 2017-04-18 DIAGNOSIS — K921 Melena: Secondary | ICD-10-CM | POA: Diagnosis not present

## 2017-04-18 NOTE — Assessment & Plan Note (Signed)
57 year old gentleman with intermittent bright red blood per rectum, no prior colonoscopy. No other bowel concerns. Once he lets us know what his full medication list is, we will plan on scheduling him for a colonoscopy with deep sedation  (due to polypharmacy and alcohol use).  I have discussed the risks, alternatives, benefits with regards to but not limited to the risk of reaction to medication, bleeding, infection, perforation and the patient is agreeable to proceed. Written consent to be obtained.

## 2017-04-18 NOTE — Progress Notes (Signed)
Medications updated. Awaiting approval for EGD prior to scheduling TCS.

## 2017-04-18 NOTE — Progress Notes (Addendum)
Primary Care Physician:  System, Provider Not In  Primary Gastroenterologist:  Roetta Sessions, MD   Chief Complaint  Patient presents with  . Rectal Bleeding    HPI:  Victor Palmer is a 57 y.o. male here at the request of Dr. Mikael Spray with [facility] for rectal bleeding.  Patient complains of intermittent bright red blood per rectum, last episode about a month ago. No associated rectal pain. Bowel function is regular. Hemoglobin was slightly low as outlined below. He is also concerned about intermittent black stools. The last time he saw one was one month ago. He's had poor appetite often on. No nausea or vomiting. Denies abdominal pain. Dropped a few pounds although has been less than about 5 pounds since November. He has chronic Genella Rife. If he misses one dose of his acid reflux medication he has severe heartburn. Denies dysphagia. Denies Pepto-Bismol use, iron use.  He did not bring a list of his medications today. He denies any over-the-counter NSAIDs/ASA. He will call back later today with a full list of his current medications.    Current Outpatient Medications  Medication Sig Dispense Refill  . benztropine (COGENTIN) 1 MG tablet Take 1 mg by mouth 2 (two) times daily.    . DULoxetine (CYMBALTA) 30 MG capsule Take 30 mg by mouth daily.    Marland Kitchen gabapentin (NEURONTIN) 300 MG capsule Take 300 mg by mouth 3 (three) times daily.    . pravastatin (PRAVACHOL) 10 MG tablet Take 10 mg by mouth daily.    . QUEtiapine (SEROQUEL) 200 MG tablet Take 200 mg by mouth 2 (two) times daily.    . traZODone (DESYREL) 50 MG tablet Take 50 mg by mouth at bedtime.     No current facility-administered medications for this visit.     Allergies as of 04/18/2017 - Review Complete 04/18/2017  Allergen Reaction Noted  . Contrast media [iodinated diagnostic agents] Nausea And Vomiting 12/28/2012    Past Medical History:  Diagnosis Date  . Bipolar 1 disorder (HCC)   . Chronic back pain   . GERD  (gastroesophageal reflux disease)   . High cholesterol   . Hypertension   . PTSD (post-traumatic stress disorder)   . Type II or unspecified type diabetes mellitus without mention of complication, not stated as uncontrolled    dietary controlled    Past Surgical History:  Procedure Laterality Date  . none      Family History  Problem Relation Age of Onset  . Hypertension Unknown   . Seizures Mother        started at age 22   . Colon cancer Neg Hx     Social History   Socioeconomic History  . Marital status: Legally Separated    Spouse name: Not on file  . Number of children: Not on file  . Years of education: Not on file  . Highest education level: Not on file  Social Needs  . Financial resource strain: Not on file  . Food insecurity - worry: Not on file  . Food insecurity - inability: Not on file  . Transportation needs - medical: Not on file  . Transportation needs - non-medical: Not on file  Occupational History  . Not on file  Tobacco Use  . Smoking status: Current Every Day Smoker    Types: Cigarettes  . Smokeless tobacco: Former Neurosurgeon    Types: Chew  Substance and Sexual Activity  . Alcohol use: Yes    Comment: daily,  about 1 beer 24 ounce daily  . Drug use: No  . Sexual activity: Yes    Partners: Female  Other Topics Concern  . Not on file  Social History Narrative  . Not on file      ROS:  General: Negative for  fever, chills, fatigue, weakness. See HPI Eyes: Negative for vision changes.  ENT: Negative for hoarseness, difficulty swallowing , nasal congestion. CV: Negative for chest pain, angina, palpitations, dyspnea on exertion, peripheral edema.  Respiratory: Negative for dyspnea at rest, dyspnea on exertion, cough, sputum, wheezing.  GI: See history of present illness. GU:  Negative for dysuria, hematuria, urinary incontinence, urinary frequency, nocturnal urination.  MS: positive for joint pain, low back pain.  Derm: Negative for rash or  itching.  Neuro: Negative for weakness, abnormal sensation, seizure, frequent headaches, memory loss, confusion.  Psych: Negative for anxiety, depression, suicidal ideation, hallucinations.  Endo: Negative for unusual weight change.  Heme: Negative for bruising or bleeding. Allergy: Negative for rash or hives.    Physical Examination:  BP (!) 143/92   Pulse 93   Temp (!) 97.4 F (36.3 C) (Oral)   Ht 5\' 10"  (1.778 m)   Wt 167 lb (75.8 kg)   BMI 23.96 kg/m    General: Well-nourished, well-developed in no acute distress.  Head: Normocephalic, atraumatic.   Eyes: Conjunctiva pink, no icterus. Mouth: Oropharyngeal mucosa moist and pink , no lesions erythema or exudate. Neck: Supple without thyromegaly, masses, or lymphadenopathy.  Lungs: Clear to auscultation bilaterally.  Heart: Regular rate and rhythm, no murmurs rubs or gallops.  Abdomen: Bowel sounds are normal, nontender, nondistended, no hepatosplenomegaly or masses, no abdominal bruits or    hernia , no rebound or guarding.   Rectal: deferred Extremities: No lower extremity edema. No clubbing or deformities.  Neuro: Alert and oriented x 4 , grossly normal neurologically.  Skin: Warm and dry, no rash or jaundice.   Psych: Alert and cooperative, normal mood and affect.  Labs: November 2018 hemoglobin 13.5 slightly low, hematocrit 41 slightly low, MCV 86.7, platelets 315,000.  Imaging Studies: Dg Ankle 2 Views Left  Result Date: 04/02/2017 CLINICAL DATA:  Fall trying to get into bed last night with left foot and ankle pain. EXAM: LEFT ANKLE - 2 VIEW COMPARISON:  None. FINDINGS: There is no evidence of fracture, dislocation, or joint effusion. There is no evidence of arthropathy or other focal bone abnormality. Soft tissues are unremarkable. IMPRESSION: Negative. Electronically Signed   By: Elberta Fortisaniel  Boyle M.D.   On: 04/02/2017 09:52   Dg Foot Complete Left  Result Date: 04/02/2017 CLINICAL DATA:  Fall last night trying to get  into bed with left ankle pain. EXAM: LEFT FOOT - COMPLETE 3+ VIEW COMPARISON:  None. FINDINGS: Examination demonstrates a transverse fracture through the base of the second metatarsal with very minimal displacement. Remainder the exam is unremarkable. IMPRESSION: Transverse fracture base of the second metatarsal with very minimal displacement. Electronically Signed   By: Elberta Fortisaniel  Boyle M.D.   On: 04/02/2017 09:51

## 2017-04-18 NOTE — Telephone Encounter (Signed)
[facility] [place] [facility].

## 2017-04-18 NOTE — Telephone Encounter (Signed)
Per Tana CoastLeslie Lewis, PA, I have [facility] and requested adding an EGD to the colonoscopy. She said for me to fax the Office [facility] to [facility] @ [phone].  I am faxing it along with a note.

## 2017-04-18 NOTE — Telephone Encounter (Signed)
OV note updated with medications now.

## 2017-04-18 NOTE — Patient Instructions (Signed)
1. Please call us back with a complete medication list today. 2. We will see if the [facility] will approve for you to have an upper endoscopy at time of your colonoscopy. We will be in touch to schedule.

## 2017-04-18 NOTE — Progress Notes (Signed)
CC'ED TO PCP 

## 2017-04-18 NOTE — Assessment & Plan Note (Signed)
Patient complains of poor appetite, approximately 5 pounds weight loss since November. He reports intermittent black stools, the last one about one month ago. He is slightly anemic. Chronic Gerd with typical control of symptoms as long as he takes his medication. We will see if the [place] system will approve for an upper endoscopy at the time of colonoscopy as it is appropriate with the above mentioned symptoms.  I have discussed the risks, alternatives, benefits with regards to but not limited to the risk of reaction to medication, bleeding, infection, perforation and the patient is agreeable to proceed. Written consent to be obtained if approved by the [place] system.

## 2017-04-27 NOTE — Telephone Encounter (Signed)
Received a phone call from [facility]. She said the info is in the hands of the PCP and that is the hold up. She will call pt and get him to call and usually that hurries them up.

## 2017-04-27 NOTE — Telephone Encounter (Signed)
I called the [facility]@ [phone] and left VM for Heather to have someone call me in reference to the PA for an EGD. I had faxed request on 04/18/2017.

## 2017-05-02 NOTE — Telephone Encounter (Signed)
T/C from E[place]ather at the [place] ( [phone]). She will fax over the PA for the EGD and we will call her with the date and time of the procedures when scheduled.

## 2017-05-03 NOTE — Telephone Encounter (Addendum)
Received the PA for the EGD # [id number].  Forwarding to NixonLeslie for orders to schedule procedures. Please call Heather at [phone] and let her know the date of the procedures when they get scheduled.

## 2017-05-03 NOTE — Telephone Encounter (Signed)
Ok

## 2017-05-03 NOTE — Telephone Encounter (Signed)
PT said he was previously on medication for reflux, but he ran out and [facility].

## 2017-05-03 NOTE — Telephone Encounter (Signed)
Tried to call pt, no answer 

## 2017-05-03 NOTE — Telephone Encounter (Addendum)
Please schedule EGD/TCS with propofol with Dr. Jena Gaussourk  Dx: poor appetite, ?melena, anemia, chronic gerd, brbpr.    Patient previously told me he was on acid reflux medication but when he gave us list of his medications I don't see one listed. Please clarify with patient.   I have his original papers for TCS approval on bottom of the my cart to be scanned.

## 2017-05-03 NOTE — Telephone Encounter (Signed)
Tried to call pt to schedule procedures, no answer, no answering machine.

## 2017-05-04 ENCOUNTER — Other Ambulatory Visit: Payer: Self-pay

## 2017-05-04 DIAGNOSIS — R630 Anorexia: Secondary | ICD-10-CM

## 2017-05-04 DIAGNOSIS — K921 Melena: Secondary | ICD-10-CM

## 2017-05-04 DIAGNOSIS — D649 Anemia, unspecified: Secondary | ICD-10-CM

## 2017-05-04 DIAGNOSIS — K219 Gastro-esophageal reflux disease without esophagitis: Secondary | ICD-10-CM

## 2017-05-04 DIAGNOSIS — K625 Hemorrhage of anus and rectum: Secondary | ICD-10-CM

## 2017-05-04 NOTE — Telephone Encounter (Signed)
Called pt, TCS/EGD w/Propofol w/RMR scheduled for 06/02/17 at 12:30pm. Pt requested rx for prep be sent to [facility]. Instructions mailed. Orders entered. Rx for prep faxed. [facility], LMOVM and informed of procedure date.

## 2017-05-04 NOTE — Telephone Encounter (Signed)
Called and informed pt of pre-op appt 05/27/17 at 11:00am.

## 2017-05-14 LAB — POCT GLYCOSYLATED HEMOGLOBIN (HGB A1C): Hemoglobin A1C: 5.8

## 2017-05-14 LAB — GLUCOSE, POCT (MANUAL RESULT ENTRY): POC Glucose: 168 mg/dl — AB (ref 70–99)

## 2017-05-25 NOTE — Patient Instructions (Signed)
Victor Palmer  05/25/2017     @PREFPERIOPPHARMACY @   Your procedure is scheduled on 06/02/2017.  [facility] to [facility] at 10:30 [facility].M.  Call this number if you have problems the morning of surgery:  [phone]   Remember:  Do not eat food or drink liquids after midnight.  Take these medicines the morning of surgery with A SIP OF WATER Cogentin, Cymbalta, Gabapentin, Seroquel   Do not wear jewelry, make-up or nail polish.  Do not wear lotions, powders, or perfumes, or deodorant.  Do not shave 48 hours prior to surgery.  Men may shave face and neck.  Do not bring valuables to the hospital.  [facility] is not responsible for any belongings or valuables.  Contacts, dentures or bridgework may not be worn into surgery.  Leave your suitcase in the car.  After surgery it may be brought to your room.  For patients admitted to the hospital, discharge time will be determined by your treatment team.  Patients discharged the day of surgery will not be allowed to drive home.    Please read over the following fact sheets that you were given. Anesthesia Post-op Instructions     PATIENT INSTRUCTIONS POST-ANESTHESIA  IMMEDIATELY FOLLOWING SURGERY:  Do not drive or operate machinery for the first twenty four hours after surgery.  Do not make any important decisions for twenty four hours after surgery or while taking narcotic pain medications or sedatives.  If you develop intractable nausea and vomiting or a severe headache please notify your doctor immediately.  FOLLOW-UP:  Please make an appointment with your surgeon as instructed. You do not need to follow up with anesthesia unless specifically instructed to do so.  WOUND CARE INSTRUCTIONS (if applicable):  Keep a dry clean dressing on the anesthesia/puncture wound site if there is drainage.  Once the wound has quit draining you may leave it open to air.  Generally you should leave the bandage intact for twenty four hours unless there is  drainage.  If the epidural site drains for more than 36-48 hours please call the anesthesia department.  QUESTIONS?:  Please feel free to call your physician or the hospital operator if you have any questions, and they will be happy to assist you.      Esophagogastroduodenoscopy Esophagogastroduodenoscopy (EGD) is a procedure to examine the lining of the esophagus, stomach, and first part of the small intestine (duodenum). This procedure is done to check for problems such as inflammation, bleeding, ulcers, or growths. During this procedure, a long, flexible, lighted tube with a camera attached (endoscope) is inserted down the throat. Tell a health care provider about:  Any allergies you have.  All medicines you are taking, including vitamins, herbs, eye drops, creams, and over-the-counter medicines.  Any problems you or family members have had with anesthetic medicines.  Any blood disorders you have.  Any surgeries you have had.  Any medical conditions you have.  Whether you are pregnant or may be pregnant. What are the risks? Generally, this is a safe procedure. However, problems may occur, including:  Infection.  Bleeding.  A tear (perforation) in the esophagus, stomach, or duodenum.  Trouble breathing.  Excessive sweating.  Spasms of the larynx.  A slowed heartbeat.  Low blood pressure.  What happens before the procedure?  Follow instructions from your health care provider about eating or drinking restrictions.  Ask your health care provider about: ? Changing or stopping your regular medicines. This is especially important if you  are taking diabetes medicines or blood thinners. ? Taking medicines such as aspirin and ibuprofen. These medicines can thin your blood. Do not take these medicines before your procedure if your health care provider instructs you not to.  Plan to have someone take you home after the procedure.  If you wear dentures, be ready to remove  them before the procedure. What happens during the procedure?  To reduce your risk of infection, your health care team will wash or sanitize their hands.  An IV tube will be put in a vein in your hand or arm. You will get medicines and fluids through this tube.  You will be given one or more of the following: ? A medicine to help you relax (sedative). ? A medicine to numb the area (local anesthetic). This medicine may be sprayed into your throat. It will make you feel more comfortable and keep you from gagging or coughing during the procedure. ? A medicine for pain.  A mouth guard may be placed in your mouth to protect your teeth and to keep you from biting on the endoscope.  You will be asked to lie on your left side.  The endoscope will be lowered down your throat into your esophagus, stomach, and duodenum.  Air will be put into the endoscope. This will help your health care provider see better.  The lining of your esophagus, stomach, and duodenum will be examined.  Your health care provider may: ? Take a tissue sample so it can be looked at in a lab (biopsy). ? Remove growths. ? Remove objects (foreign bodies) that are stuck. ? Treat any bleeding with medicines or other devices that stop tissue from bleeding. ? Widen (dilate) or stretch narrowed areas of your esophagus and stomach.  The endoscope will be taken out. The procedure may vary among health care providers and hospitals. What happens after the procedure?  Your blood pressure, heart rate, breathing rate, and blood oxygen level will be monitored often until the medicines you were given have worn off.  Do not eat or drink anything until the numbing medicine has worn off and your gag reflex has returned. This information is not intended to replace advice given to you by your health care provider. Make sure you discuss any questions you have with your health care provider. Document Released: 06/04/2004 Document Revised:  07/10/2015 Document Reviewed: 12/26/2014 Elsevier Interactive Patient Education  2018 [employer]. Colonoscopy, Adult A colonoscopy is an exam to look at the entire large intestine. During the exam, a lubricated, bendable tube is inserted into the anus and then passed into the rectum, colon, and other parts of the large intestine. A colonoscopy is often done as a part of normal colorectal screening or in response to certain symptoms, such as anemia, persistent diarrhea, abdominal pain, and blood in the stool. The exam can help screen for and diagnose medical problems, including:  Tumors.  Polyps.  Inflammation.  Areas of bleeding.  Tell a health care provider about:  Any allergies you have.  All medicines you are taking, including vitamins, herbs, eye drops, creams, and over-the-counter medicines.  Any problems you or family members have had with anesthetic medicines.  Any blood disorders you have.  Any surgeries you have had.  Any medical conditions you have.  Any problems you have had passing stool. What are the risks? Generally, this is a safe procedure. However, problems may occur, including:  Bleeding.  A tear in the intestine.  A reaction to  medicines given during the exam.  Infection (rare).  What happens before the procedure? Eating and drinking restrictions Follow instructions from your health care provider about eating and drinking, which may include:  A few days before the procedure - follow a low-fiber diet. Avoid nuts, seeds, dried fruit, raw fruits, and vegetables.  1-3 days before the procedure - follow a clear liquid diet. Drink only clear liquids, such as clear broth or bouillon, black coffee or tea, clear juice, clear soft drinks or sports drinks, gelatin dessert, and popsicles. Avoid any liquids that contain red or purple dye.  On the day of the procedure - do not eat or drink anything during the 2 hours before the procedure, or within the time  period that your health care provider recommends.  Bowel prep If you were prescribed an oral bowel prep to clean out your colon:  Take it as told by your health care provider. Starting the day before your procedure, you will need to drink a large amount of medicated liquid. The liquid will cause you to have multiple loose stools until your stool is almost clear or light green.  If your skin or anus gets irritated from diarrhea, you may use these to relieve the irritation: ? Medicated wipes, such as adult wet wipes with aloe and vitamin E. ? A skin soothing-product like petroleum jelly.  If you vomit while drinking the bowel prep, take a break for up to 60 minutes and then begin the bowel prep again. If vomiting continues and you cannot take the bowel prep without vomiting, call your health care provider.  General instructions  Ask your health care provider about changing or stopping your regular medicines. This is especially important if you are taking diabetes medicines or blood thinners.  Plan to have someone take you home from the hospital or clinic. What happens during the procedure?  An IV tube may be inserted into one of your veins.  You will be given medicine to help you relax (sedative).  To reduce your risk of infection: ? Your health care team will wash or sanitize their hands. ? Your anal area will be washed with soap.  You will be asked to lie on your side with your knees bent.  Your health care provider will lubricate a long, thin, flexible tube. The tube will have a camera and a light on the end.  The tube will be inserted into your anus.  The tube will be gently eased through your rectum and colon.  Air will be delivered into your colon to keep it open. You may feel some pressure or cramping.  The camera will be used to take images during the procedure.  A small tissue sample may be removed from your body to be examined under a microscope (biopsy). If any  potential problems are found, the tissue will be sent to a lab for testing.  If small polyps are found, your health care provider may remove them and have them checked for cancer cells.  The tube that was inserted into your anus will be slowly removed. The procedure may vary among health care providers and hospitals. What happens after the procedure?  Your blood pressure, heart rate, breathing rate, and blood oxygen level will be monitored until the medicines you were given have worn off.  Do not drive for 24 hours after the exam.  You may have a small amount of blood in your stool.  You may pass gas and have mild abdominal cramping  or bloating due to the air that was used to inflate your colon during the exam.  It is up to you to get the results of your procedure. Ask your health care provider, or the department performing the procedure, when your results will be ready. This information is not intended to replace advice given to you by your health care provider. Make sure you discuss any questions you have with your health care provider. Document Released: 01/30/2000 Document Revised: 12/03/2015 Document Reviewed: 04/15/2015 Elsevier Interactive Patient Education  2018 [employer].

## 2017-05-27 ENCOUNTER — Encounter (HOSPITAL_COMMUNITY)
Admission: RE | Admit: 2017-05-27 | Discharge: 2017-05-27 | Disposition: A | Discharge status: Home / Self Care | Payer: Non-veteran care | Source: Ambulatory Visit | Attending: Internal Medicine | Admitting: Internal Medicine

## 2017-05-27 ENCOUNTER — Other Ambulatory Visit: Payer: Self-pay

## 2017-05-27 ENCOUNTER — Encounter (HOSPITAL_COMMUNITY): Payer: Self-pay

## 2017-05-27 DIAGNOSIS — Z01812 Encounter for preprocedural laboratory examination: Secondary | ICD-10-CM | POA: Diagnosis not present

## 2017-05-27 HISTORY — DX: Unspecified convulsions: R56.9

## 2017-05-27 LAB — RAPID URINE DRUG SCREEN, HOSP PERFORMED
AMPHETAMINES: NOT DETECTED
Barbiturates: NOT DETECTED
Benzodiazepines: NOT DETECTED
Cocaine: POSITIVE — AB
OPIATES: NOT DETECTED
Tetrahydrocannabinol: POSITIVE — AB

## 2017-05-27 LAB — CBC WITH DIFFERENTIAL/PLATELET
BASOS ABS: 0 10*3/uL (ref 0.0–0.1)
BASOS PCT: 0 %
EOS ABS: 0.3 10*3/uL (ref 0.0–0.7)
EOS PCT: 3 %
HCT: 38.9 % — ABNORMAL LOW (ref 39.0–52.0)
HEMOGLOBIN: 12.5 g/dL — AB (ref 13.0–17.0)
Lymphocytes Relative: 42 %
Lymphs Abs: 4 10*3/uL (ref 0.7–4.0)
MCH: 27.1 pg (ref 26.0–34.0)
MCHC: 32.1 g/dL (ref 30.0–36.0)
MCV: 84.4 fL (ref 78.0–100.0)
Monocytes Absolute: 0.6 10*3/uL (ref 0.1–1.0)
Monocytes Relative: 7 %
NEUTROS PCT: 48 %
Neutro Abs: 4.7 10*3/uL (ref 1.7–7.7)
PLATELETS: 309 10*3/uL (ref 150–400)
RBC: 4.61 MIL/uL (ref 4.22–5.81)
RDW: 15.1 % (ref 11.5–15.5)
WBC: 9.5 10*3/uL (ref 4.0–10.5)

## 2017-05-27 LAB — BASIC METABOLIC PANEL
ANION GAP: 10 (ref 5–15)
BUN: 16 mg/dL (ref 6–20)
CO2: 25 mmol/L (ref 22–32)
Calcium: 9.4 mg/dL (ref 8.9–10.3)
Chloride: 103 mmol/L (ref 101–111)
Creatinine, Ser: 0.99 mg/dL (ref 0.61–1.24)
Glucose, Bld: 128 mg/dL — ABNORMAL HIGH (ref 65–99)
Potassium: 4.2 mmol/L (ref 3.5–5.1)
SODIUM: 138 mmol/L (ref 135–145)

## 2017-05-30 ENCOUNTER — Telehealth: Payer: Self-pay | Admitting: Internal Medicine

## 2017-05-30 NOTE — Telephone Encounter (Signed)
REFERRAL IS SCANNED

## 2017-05-30 NOTE — Telephone Encounter (Signed)
Spoke with patient and he is aware. He is scheduled for OV for 6/28 at 8:30am. Fowarding to stacy/susan to make sure we have referral approval

## 2017-05-30 NOTE — Telephone Encounter (Signed)
Reviewed

## 2017-05-30 NOTE — Telephone Encounter (Signed)
Kim from Short Stay called to let us know that patient was positive for cocaine and his procedure for the 18th with RMR will need to be cancelled.

## 2017-05-30 NOTE — Telephone Encounter (Signed)
Please make sure patient is aware that his procedures are being cancelled due to positive drug screen for cocaine. He cannot undergo anesthesia with a positive cocaine test.   He can come back in for ov and reschedule but he will have to have a negative UDS at time of prep op or he will be cancelled again.   We also need to make sure his [facility] approval for OV, EGD, TCS will still be in date.

## 2017-05-30 NOTE — Telephone Encounter (Signed)
Fowarding to LSL to advise 

## 2017-06-02 ENCOUNTER — Ambulatory Visit (HOSPITAL_COMMUNITY): Admission: RE | Admit: 2017-06-02 | Payer: Non-veteran care | Source: Ambulatory Visit | Admitting: Internal Medicine

## 2017-06-02 ENCOUNTER — Encounter (HOSPITAL_COMMUNITY): Admission: RE | Payer: Self-pay | Source: Ambulatory Visit

## 2017-06-02 SURGERY — COLONOSCOPY WITH PROPOFOL
Anesthesia: Monitor Anesthesia Care

## 2017-08-12 ENCOUNTER — Telehealth: Payer: Self-pay | Admitting: Internal Medicine

## 2017-08-12 ENCOUNTER — Encounter: Payer: Self-pay | Admitting: Internal Medicine

## 2017-08-12 ENCOUNTER — Ambulatory Visit: Payer: Non-veteran care | Admitting: Gastroenterology

## 2017-08-12 NOTE — Telephone Encounter (Signed)
PATIENT WAS A NO SHOW AND LETTER SENT  °

## 2017-08-22 ENCOUNTER — Telehealth: Payer: Self-pay

## 2017-08-22 NOTE — Telephone Encounter (Signed)
Noted. Tia AlertFYI Camille.

## 2017-08-22 NOTE — Telephone Encounter (Signed)
Pt called office, he received no show letter for appt 08/12/17. He said he hasn't missed 3 appts. Explained to him that letter was for no show 08/12/17 and doesn't say he missed 3 appts. Pt rambling. [place]. States no need for him to come back here if we're just going to drug test him before his procedure and then not do the procedure a week later. Explained to him that if he tests positive for drugs he can't have procedure. He said to take him off our records and hung up the phone.  Routing to LSL (seen him last in office).

## 2017-08-23 NOTE — Telephone Encounter (Signed)
Reviewed

## 2018-09-01 ENCOUNTER — Other Ambulatory Visit: Payer: Self-pay | Admitting: *Deleted

## 2018-09-01 DIAGNOSIS — Z20822 Contact with and (suspected) exposure to covid-19: Secondary | ICD-10-CM

## 2018-09-06 LAB — NOVEL CORONAVIRUS, NAA: SARS-CoV-2, NAA: NOT DETECTED

## 2019-09-26 ENCOUNTER — Encounter (HOSPITAL_COMMUNITY): Payer: Self-pay | Admitting: Emergency Medicine

## 2019-09-26 ENCOUNTER — Other Ambulatory Visit: Payer: Self-pay

## 2019-09-26 ENCOUNTER — Emergency Department (HOSPITAL_COMMUNITY)
Admission: EM | Admit: 2019-09-26 | Discharge: 2019-09-26 | Disposition: A | Discharge status: Home / Self Care | Payer: No Typology Code available for payment source | Attending: Emergency Medicine | Admitting: Emergency Medicine

## 2019-09-26 DIAGNOSIS — I10 Essential (primary) hypertension: Secondary | ICD-10-CM | POA: Diagnosis not present

## 2019-09-26 DIAGNOSIS — Z79899 Other long term (current) drug therapy: Secondary | ICD-10-CM | POA: Insufficient documentation

## 2019-09-26 DIAGNOSIS — E119 Type 2 diabetes mellitus without complications: Secondary | ICD-10-CM | POA: Diagnosis not present

## 2019-09-26 DIAGNOSIS — F17210 Nicotine dependence, cigarettes, uncomplicated: Secondary | ICD-10-CM | POA: Diagnosis not present

## 2019-09-26 LAB — CBC WITH DIFFERENTIAL/PLATELET
Abs Immature Granulocytes: 0.07 10*3/uL (ref 0.00–0.07)
Basophils Absolute: 0.1 10*3/uL (ref 0.0–0.1)
Basophils Relative: 1 %
Eosinophils Absolute: 0.2 10*3/uL (ref 0.0–0.5)
Eosinophils Relative: 2 %
HCT: 42.9 % (ref 39.0–52.0)
Hemoglobin: 13.9 g/dL (ref 13.0–17.0)
Immature Granulocytes: 1 %
Lymphocytes Relative: 39 %
Lymphs Abs: 3.9 10*3/uL (ref 0.7–4.0)
MCH: 28.1 pg (ref 26.0–34.0)
MCHC: 32.4 g/dL (ref 30.0–36.0)
MCV: 86.7 fL (ref 80.0–100.0)
Monocytes Absolute: 0.7 10*3/uL (ref 0.1–1.0)
Monocytes Relative: 7 %
Neutro Abs: 5 10*3/uL (ref 1.7–7.7)
Neutrophils Relative %: 50 %
Platelets: 382 10*3/uL (ref 150–400)
RBC: 4.95 MIL/uL (ref 4.22–5.81)
RDW: 16.2 % — ABNORMAL HIGH (ref 11.5–15.5)
WBC: 10 10*3/uL (ref 4.0–10.5)
nRBC: 0 % (ref 0.0–0.2)

## 2019-09-26 LAB — BASIC METABOLIC PANEL
Anion gap: 7 (ref 5–15)
BUN: 14 mg/dL (ref 6–20)
CO2: 27 mmol/L (ref 22–32)
Calcium: 9.1 mg/dL (ref 8.9–10.3)
Chloride: 100 mmol/L (ref 98–111)
Creatinine, Ser: 1.02 mg/dL (ref 0.61–1.24)
GFR calc Af Amer: >60 mL/min (ref >60)
GFR calc non Af Amer: >60 mL/min (ref >60)
Glucose, Bld: 90 mg/dL (ref 70–99)
Potassium: 4 mmol/L (ref 3.5–5.1)
Sodium: 134 mmol/L — ABNORMAL LOW (ref 135–145)

## 2019-09-26 MED ORDER — LISINOPRIL 40 MG PO TABS: 40.0000 mg | ORAL_TABLET | Freq: Every day | ORAL | 1 refills | Status: AC

## 2019-09-26 MED ORDER — LISINOPRIL 10 MG PO TABS
40.0000 mg | ORAL_TABLET | Freq: Every day | ORAL | Status: DC
  Administered 2019-09-26: 14:00:00 40 mg via ORAL
  Filled 2019-09-26: qty 4
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
## 2019-09-26 NOTE — Discharge Instructions (Addendum)
We have sent prescription for lisinopril to your pharmacy.  This is for a 30 month supply. Take as prescribed.  Your blood work today was all reassuring.

## 2019-09-26 NOTE — ED Triage Notes (Addendum)
Pt reports headache, nausea, hypertension since Thursday. Pt reports has been taking lisinopril for last two months. Pt reports has been out of bp meds x1 month. Pt reports [facility].

## 2019-10-08 ENCOUNTER — Emergency Department (HOSPITAL_COMMUNITY): Payer: No Typology Code available for payment source

## 2019-10-08 ENCOUNTER — Other Ambulatory Visit: Payer: Self-pay

## 2019-10-08 ENCOUNTER — Encounter (HOSPITAL_COMMUNITY): Payer: Self-pay

## 2019-10-08 DIAGNOSIS — Z5321 Procedure and treatment not carried out due to patient leaving prior to being seen by health care provider: Secondary | ICD-10-CM | POA: Diagnosis not present

## 2019-10-08 DIAGNOSIS — I10 Essential (primary) hypertension: Secondary | ICD-10-CM | POA: Insufficient documentation

## 2019-10-08 DIAGNOSIS — R519 Headache, unspecified: Secondary | ICD-10-CM | POA: Diagnosis not present

## 2019-10-08 DIAGNOSIS — R079 Chest pain, unspecified: Secondary | ICD-10-CM | POA: Insufficient documentation

## 2019-10-08 LAB — BASIC METABOLIC PANEL
Anion gap: 12 (ref 5–15)
BUN: 13 mg/dL (ref 6–20)
CO2: 22 mmol/L (ref 22–32)
Calcium: 9.5 mg/dL (ref 8.9–10.3)
Chloride: 99 mmol/L (ref 98–111)
Creatinine, Ser: 0.95 mg/dL (ref 0.61–1.24)
GFR calc Af Amer: >60 mL/min (ref >60)
GFR calc non Af Amer: >60 mL/min (ref >60)
Glucose, Bld: 93 mg/dL (ref 70–99)
Potassium: 4.3 mmol/L (ref 3.5–5.1)
Sodium: 133 mmol/L — ABNORMAL LOW (ref 135–145)

## 2019-10-08 LAB — CBC
HCT: 43.2 % (ref 39.0–52.0)
Hemoglobin: 14.3 g/dL (ref 13.0–17.0)
MCH: 27.9 pg (ref 26.0–34.0)
MCHC: 33.1 g/dL (ref 30.0–36.0)
MCV: 84.4 fL (ref 80.0–100.0)
Platelets: 362 10*3/uL (ref 150–400)
RBC: 5.12 MIL/uL (ref 4.22–5.81)
RDW: 15.1 % (ref 11.5–15.5)
WBC: 13.4 10*3/uL — ABNORMAL HIGH (ref 4.0–10.5)
nRBC: 0 % (ref 0.0–0.2)

## 2019-10-08 LAB — TROPONIN I (HIGH SENSITIVITY): Troponin I (High Sensitivity): 7 ng/L (ref <18)

## 2019-10-08 NOTE — ED Triage Notes (Signed)
Pt to er, pt states that he is here for htn, headache and some chest pain, states that he had problems with his bp recently and got a new blood pressure med from the [place], but it isn't helping.

## 2019-10-09 ENCOUNTER — Emergency Department (HOSPITAL_COMMUNITY)
Admission: EM | Admit: 2019-10-09 | Discharge: 2019-10-09 | Disposition: A | Discharge status: Home / Self Care | Payer: No Typology Code available for payment source | Attending: Emergency Medicine | Admitting: Emergency Medicine

## 2020-04-23 ENCOUNTER — Encounter (HOSPITAL_COMMUNITY): Payer: Self-pay | Admitting: Emergency Medicine

## 2020-04-23 ENCOUNTER — Other Ambulatory Visit: Payer: Self-pay

## 2020-04-23 ENCOUNTER — Emergency Department (HOSPITAL_COMMUNITY)
Admission: EM | Admit: 2020-04-23 | Discharge: 2020-04-23 | Disposition: A | Discharge status: Home / Self Care | Payer: No Typology Code available for payment source | Attending: Emergency Medicine | Admitting: Emergency Medicine

## 2020-04-23 DIAGNOSIS — F1410 Cocaine abuse, uncomplicated: Secondary | ICD-10-CM | POA: Diagnosis present

## 2020-04-23 DIAGNOSIS — Z79899 Other long term (current) drug therapy: Secondary | ICD-10-CM | POA: Insufficient documentation

## 2020-04-23 DIAGNOSIS — F1020 Alcohol dependence, uncomplicated: Secondary | ICD-10-CM | POA: Diagnosis not present

## 2020-04-23 DIAGNOSIS — E119 Type 2 diabetes mellitus without complications: Secondary | ICD-10-CM | POA: Insufficient documentation

## 2020-04-23 DIAGNOSIS — F17210 Nicotine dependence, cigarettes, uncomplicated: Secondary | ICD-10-CM | POA: Insufficient documentation

## 2020-04-23 DIAGNOSIS — I10 Essential (primary) hypertension: Secondary | ICD-10-CM | POA: Diagnosis not present

## 2020-04-23 HISTORY — DX: Other psychoactive substance abuse, uncomplicated: F19.10
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
## 2020-04-23 NOTE — ED Triage Notes (Addendum)
Pt to the ED requesting detox from crack cocaine -last used 2 hours ago at 2000.  Pt also uses alcohol, but does not believe that to be a problem.

## 2020-04-23 NOTE — ED Notes (Addendum)
Discharge papers given to pt and patient stated "I dont want these papers" and threw them back at me. Pt is loud and stormed out. Pt did not sign discharge papers. Pt refused vitals

## 2021-07-16 ENCOUNTER — Encounter (HOSPITAL_COMMUNITY): Payer: Self-pay | Admitting: Emergency Medicine

## 2021-07-16 ENCOUNTER — Emergency Department (HOSPITAL_COMMUNITY)
Admission: EM | Admit: 2021-07-16 | Discharge: 2021-07-16 | Disposition: A | Discharge status: Home / Self Care | Payer: No Typology Code available for payment source | Attending: Emergency Medicine | Admitting: Emergency Medicine

## 2021-07-16 ENCOUNTER — Emergency Department (HOSPITAL_COMMUNITY): Payer: No Typology Code available for payment source

## 2021-07-16 ENCOUNTER — Other Ambulatory Visit: Payer: Self-pay

## 2021-07-16 DIAGNOSIS — M542 Cervicalgia: Secondary | ICD-10-CM | POA: Diagnosis not present

## 2021-07-16 DIAGNOSIS — M25511 Pain in right shoulder: Secondary | ICD-10-CM | POA: Diagnosis not present

## 2021-07-16 DIAGNOSIS — E119 Type 2 diabetes mellitus without complications: Secondary | ICD-10-CM | POA: Insufficient documentation

## 2021-07-16 LAB — CBG MONITORING, ED: Glucose-Capillary: 98 mg/dL (ref 70–99)

## 2021-07-16 MED ORDER — DIAZEPAM 5 MG PO TABS: 5.0000 mg | ORAL_TABLET | Freq: Three times a day (TID) | ORAL | 0 refills | Status: AC | PRN

## 2021-07-16 MED ORDER — DIAZEPAM 5 MG PO TABS
5.0000 mg | ORAL_TABLET | Freq: Once | ORAL | Status: AC
  Administered 2021-07-16: 5 mg via ORAL
  Filled 2021-07-16: qty 1
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
## 2021-07-16 NOTE — ED Triage Notes (Signed)
Pt is having right shoulder and neck stiffness x 1 week.

## 2021-07-16 NOTE — Discharge Instructions (Signed)
You have been seen here for neck and shoulder pain. I recommend taking over-the-counter pain medications like ibuprofen and/or Tylenol every 6 as needed.  Please follow dosage and on the back of bottle.  I also recommend applying heat to the area and stretching out the muscles as this will help decrease stiffness and pain.  I have given you information on exercises please follow.  Please follow-up with Ortho for reevaluation.  Come back to the emergency department if you develop chest pain, shortness of breath, severe abdominal pain, uncontrolled nausea, vomiting, diarrhea.

## 2021-07-22 ENCOUNTER — Other Ambulatory Visit: Payer: Self-pay

## 2021-07-22 ENCOUNTER — Encounter (HOSPITAL_COMMUNITY): Payer: Self-pay | Admitting: Emergency Medicine

## 2021-07-22 DIAGNOSIS — Z5321 Procedure and treatment not carried out due to patient leaving prior to being seen by health care provider: Secondary | ICD-10-CM | POA: Diagnosis not present

## 2021-07-22 DIAGNOSIS — M549 Dorsalgia, unspecified: Secondary | ICD-10-CM | POA: Diagnosis not present

## 2021-07-22 DIAGNOSIS — M542 Cervicalgia: Secondary | ICD-10-CM | POA: Insufficient documentation

## 2021-07-22 NOTE — ED Triage Notes (Signed)
Pt c/o neck and back pain x 2 weeks, seen for same 07/16/21, no relief with prescribed meds

## 2021-07-23 ENCOUNTER — Emergency Department (HOSPITAL_COMMUNITY)
Admission: EM | Admit: 2021-07-23 | Discharge: 2021-07-23 | Disposition: A | Discharge status: Home / Self Care | Payer: No Typology Code available for payment source | Attending: Emergency Medicine | Admitting: Emergency Medicine

## 2021-07-23 ENCOUNTER — Emergency Department (HOSPITAL_COMMUNITY)
Admission: EM | Admit: 2021-07-23 | Discharge: 2021-07-23 | Discharge status: Against Medical Advice | Payer: No Typology Code available for payment source | Attending: Emergency Medicine | Admitting: Emergency Medicine

## 2021-07-23 ENCOUNTER — Encounter (HOSPITAL_COMMUNITY): Payer: Self-pay | Admitting: Emergency Medicine

## 2021-07-23 ENCOUNTER — Other Ambulatory Visit: Payer: Self-pay

## 2021-07-23 DIAGNOSIS — M436 Torticollis: Secondary | ICD-10-CM | POA: Diagnosis not present

## 2021-07-23 MED ORDER — PREDNISONE 10 MG PO TABS: ORAL_TABLET | ORAL | 0 refills | Status: AC

## 2021-07-23 MED ORDER — KETOROLAC TROMETHAMINE 60 MG/2ML IM SOLN
60.0000 mg | Freq: Once | INTRAMUSCULAR | Status: AC
  Administered 2021-07-23: 60 mg via INTRAMUSCULAR
  Filled 2021-07-23: qty 2

## 2021-07-23 MED ORDER — METHOCARBAMOL 500 MG PO TABS: 500.0000 mg | ORAL_TABLET | Freq: Three times a day (TID) | ORAL | 0 refills | Status: AC

## 2021-07-23 MED ORDER — HYDROCODONE-ACETAMINOPHEN 5-325 MG PO TABS: ORAL_TABLET | ORAL | 0 refills | Status: AC

## 2021-07-23 NOTE — ED Notes (Signed)
Pt teaching provided on medications that may cause drowsiness. Pt instructed not to drive or operate heavy machinery while taking the prescribed medication. Pt verbalized understanding.   Pt provided discharge instructions and prescription information. Pt was given the opportunity to ask questions and questions were answered.   

## 2021-07-23 NOTE — Discharge Instructions (Signed)
Continue to alternate ice and heat to your neck.  Take the medication as directed.  Please follow-up with your primary care provider at the [facility] next week for recheck.

## 2021-07-23 NOTE — ED Triage Notes (Signed)
Pt here for neck and right shoulder pain, seen for same on 07/22/21, 07/16/21, says muscle relaxers are not helping.

## 2021-07-26 NOTE — ED Provider Notes (Signed)
[facility] EMERGENCY DEPARTMENT Provider Note   CSN: [id number] Arrival date & time: 07/23/21  1153     History  Chief Complaint  Patient presents with   Torticollis    Victor Palmer is a 61 y.o. male.  HPI      Victor Palmer is a 61 y.o. male who presents to the Emergency Department complaining of persistent right neck pain.  He has been seen here on two other visits for same.  States he was given medication for spasm which is not helping.  Taking OTC pain relievers also without relief.  Pain with any right sided movement of his neck and pain radiating into the right shoulder.  He denies pain or numbness into his right arm or hand.  No fever or chills.no new injury or worsening symptoms   Home Medications Prior to Admission medications   Medication Sig Start Date End Date Taking? Authorizing Provider  HYDROcodone-acetaminophen (NORCO/VICODIN) 5-325 MG tablet Take one tab po q 4 hrs prn pain 07/23/21  Yes Loann Chahal, PA-C  methocarbamol (ROBAXIN) 500 MG tablet Take 1 tablet (500 mg total) by mouth 3 (three) times daily. 07/23/21  Yes Hilman Kissling, PA-C  predniSONE (DELTASONE) 10 MG tablet Take 6 tablets day one, 5 tablets day two, 4 tablets day three, 3 tablets day four, 2 tablets day five, then 1 tablet day six 07/23/21  Yes Phyllistine Domingos, PA-C  benztropine (COGENTIN) 1 MG tablet Take 1 mg by mouth 2 (two) times daily.    [provider]  DULoxetine (CYMBALTA) 30 MG capsule Take 30 mg by mouth daily.    [provider]  gabapentin (NEURONTIN) 300 MG capsule Take 300 mg by mouth 3 (three) times daily.    [provider]  lisinopril (ZESTRIL) 40 MG tablet Take 1 tablet (40 mg total) by mouth daily. 09/26/19 11/25/19  Walisiewicz, Yvonna Alanis E, PA-C  pravastatin (PRAVACHOL) 10 MG tablet Take 10 mg by mouth daily.    [provider]  QUEtiapine (SEROQUEL) 200 MG tablet Take 200 mg by mouth 2 (two) times daily.    [provider]  traZODone  (DESYREL) 50 MG tablet Take 50 mg by mouth at bedtime.    [provider]      Allergies    Contrast media [iodinated contrast media]    Review of Systems   Review of Systems  Constitutional:  Negative for chills and fever.  Respiratory:  Negative for chest tightness and shortness of breath.   Cardiovascular:  Negative for chest pain.  Gastrointestinal:  Negative for nausea and vomiting.  Musculoskeletal:  Positive for myalgias and neck pain. Negative for back pain.  Skin:  Negative for color change and rash.  Neurological:  Negative for dizziness, weakness, numbness and headaches.  All other systems reviewed and are negative.   Physical Exam Updated Vital Signs BP (!) 174/109 (BP Location: Left Arm) Comment: pt did not take his B/P meds today.  Pulse 80   Temp 98 F (36.7 C) (Oral)   Resp 16   Ht 5\' 10"  (1.778 m)   Wt 72.6 kg   SpO2 98%   BMI 22.97 kg/m  Physical Exam Vitals and nursing note reviewed.  Constitutional:      Appearance: Normal appearance. He is not ill-appearing or toxic-appearing.  Eyes:     Extraocular Movements: Extraocular movements intact.     Conjunctiva/sclera: Conjunctivae normal.     Pupils: Pupils are equal, round, and reactive to light.  Neck:  Trachea: Phonation normal.     Comments: Diffuse ttp of the  right cervical paraspinal muscles and trapezius muscles.   Cardiovascular:     Rate and Rhythm: Normal rate and regular rhythm.     Pulses: Normal pulses.  Pulmonary:     Effort: Pulmonary effort is normal.     Breath sounds: Normal breath sounds.  Chest:     Chest wall: No tenderness.  Abdominal:     Palpations: Abdomen is soft.     Tenderness: There is no abdominal tenderness.  Musculoskeletal:     Cervical back: Torticollis and tenderness present. No rigidity. Muscular tenderness present. No spinous process tenderness.  Skin:    General: Skin is warm.     Capillary Refill: Capillary refill takes less than 2 seconds.      Findings: No erythema or rash.  Neurological:     General: No focal deficit present.     Mental Status: He is alert.     Sensory: No sensory deficit.     Motor: No weakness.     ED Results / Procedures / Treatments   Labs (all labs ordered are listed, but only abnormal results are displayed) Labs Reviewed - No data to display  EKG None  Radiology No results found.  Procedures Procedures    Medications Ordered in ED Medications  ketorolac (TORADOL) injection 60 mg (60 mg Intramuscular Given 07/23/21 1456)    ED Course/ Medical Decision Making/ A&P                           Medical Decision Making Risk Prescription drug management.   Pt here for pain control.  Seen here on two prior visists for same.  CT of the C and T spine done or prior visit.  No evidence of acute injury.  No new injury or worsening sx's to suggest need for additional imaging today.    Doubt emergent process.    Database reviewed, will provide short course of pain medication with understanding that he will arrange f/u with ortho.  He is agreeable to plan.          Final Clinical Impression(s) / ED Diagnoses Final diagnoses:  Torticollis    Rx / DC Orders ED Discharge Orders          Ordered    methocarbamol (ROBAXIN) 500 MG tablet  3 times daily        07/23/21 1446    predniSONE (DELTASONE) 10 MG tablet        07/23/21 1516    HYDROcodone-acetaminophen (NORCO/VICODIN) 5-325 MG tablet        07/23/21 1516              Pauline Aus, PA-C 07/26/21 1502    Benjiman Core, MD 07/26/21 1513

## 2022-05-07 ENCOUNTER — Emergency Department (HOSPITAL_COMMUNITY)
Admission: EM | Admit: 2022-05-07 | Discharge: 2022-05-07 | Disposition: A | Discharge status: Home / Self Care | Payer: No Typology Code available for payment source | Attending: Emergency Medicine | Admitting: Emergency Medicine

## 2022-05-07 ENCOUNTER — Emergency Department (HOSPITAL_COMMUNITY): Payer: No Typology Code available for payment source

## 2022-05-07 ENCOUNTER — Other Ambulatory Visit: Payer: Self-pay

## 2022-05-07 DIAGNOSIS — I10 Essential (primary) hypertension: Secondary | ICD-10-CM | POA: Diagnosis not present

## 2022-05-07 DIAGNOSIS — M25511 Pain in right shoulder: Secondary | ICD-10-CM | POA: Insufficient documentation

## 2022-05-07 DIAGNOSIS — Z79899 Other long term (current) drug therapy: Secondary | ICD-10-CM | POA: Insufficient documentation

## 2022-05-07 DIAGNOSIS — R0789 Other chest pain: Secondary | ICD-10-CM

## 2022-05-07 LAB — BASIC METABOLIC PANEL
Anion gap: 7 (ref 5–15)
BUN: 12 mg/dL (ref 6–20)
CO2: 26 mmol/L (ref 22–32)
Calcium: 9.1 mg/dL (ref 8.9–10.3)
Chloride: 101 mmol/L (ref 98–111)
Creatinine, Ser: 0.9 mg/dL (ref 0.61–1.24)
GFR, Estimated: >60 mL/min (ref >60)
Glucose, Bld: 97 mg/dL (ref 70–99)
Potassium: 3.6 mmol/L (ref 3.5–5.1)
Sodium: 134 mmol/L — ABNORMAL LOW (ref 135–145)

## 2022-05-07 LAB — CBC WITH DIFFERENTIAL/PLATELET
Abs Immature Granulocytes: 0.04 10*3/uL (ref 0.00–0.07)
Basophils Absolute: 0 10*3/uL (ref 0.0–0.1)
Basophils Relative: 0 %
Eosinophils Absolute: 0.1 10*3/uL (ref 0.0–0.5)
Eosinophils Relative: 1 %
HCT: 41.5 % (ref 39.0–52.0)
Hemoglobin: 13.9 g/dL (ref 13.0–17.0)
Immature Granulocytes: 0 %
Lymphocytes Relative: 27 %
Lymphs Abs: 2.8 10*3/uL (ref 0.7–4.0)
MCH: 27.9 pg (ref 26.0–34.0)
MCHC: 33.5 g/dL (ref 30.0–36.0)
MCV: 83.2 fL (ref 80.0–100.0)
Monocytes Absolute: 0.6 10*3/uL (ref 0.1–1.0)
Monocytes Relative: 6 %
Neutro Abs: 6.8 10*3/uL (ref 1.7–7.7)
Neutrophils Relative %: 66 %
Platelets: 325 10*3/uL (ref 150–400)
RBC: 4.99 MIL/uL (ref 4.22–5.81)
RDW: 16.4 % — ABNORMAL HIGH (ref 11.5–15.5)
WBC: 10.4 10*3/uL (ref 4.0–10.5)
nRBC: 0 % (ref 0.0–0.2)

## 2022-05-07 LAB — TROPONIN I (HIGH SENSITIVITY): Troponin I (High Sensitivity): 6 ng/L (ref <18)

## 2022-05-07 NOTE — ED Triage Notes (Addendum)
Pt states his BP elevated today. Pt with c/o pain to right upper chest/neck.  Also c/o bruise to left forearm. Pt states he had a reading of 197/112 at home

## 2022-05-07 NOTE — ED Provider Notes (Signed)
[facility] Provider Note   CSN: [id number] Arrival date & time: 05/07/22  2031     History  Chief Complaint  Patient presents with   Hypertension    Victor Palmer is a 62 y.o. male.  With a history of hypertension, bipolar 1, PTSD, seizures, GERD, substance abuse who presents to the ED for evaluation of hyper tension.  He states he ran out of his blood pressure medication 2 weeks ago.  He called to get a refill but was unable to fulfill this because he was unable to read the number off of his prescription bottle.  He then called the [facility] and gave him his blood pressure readings and was encouraged to come to the ED for further evaluation.  He reports a blood pressure of 197/112 at home today.  He reports right-sided upper chest pain near his shoulder.  He noticed this approximately 1 week ago.  He woke up and found that his chest and shoulder were aching.  It is worse with movement.  He denies chest tightness, shortness of breath, cough, dizziness, lightheadedness, headaches, nausea, vomiting, diaphoresis.  Pain is not exertional.   Hypertension       Home Medications Prior to Admission medications   Medication Sig Start Date End Date Taking? Authorizing Provider  benztropine (COGENTIN) 1 MG tablet Take 1 mg by mouth 2 (two) times daily.    [provider]  DULoxetine (CYMBALTA) 30 MG capsule Take 30 mg by mouth daily.    [provider]  gabapentin (NEURONTIN) 300 MG capsule Take 300 mg by mouth 3 (three) times daily.    [provider]  HYDROcodone-acetaminophen (NORCO/VICODIN) 5-325 MG tablet Take one tab po q 4 hrs prn pain 07/23/21   Triplett, Tammy, PA-C  lisinopril (ZESTRIL) 40 MG tablet Take 1 tablet (40 mg total) by mouth daily. 09/26/19 11/25/19  Walisiewicz, Harley Hallmark, PA-C  methocarbamol (ROBAXIN) 500 MG tablet Take 1 tablet (500 mg total) by mouth 3 (three) times daily. 07/23/21   Triplett, Tammy, PA-C   pravastatin (PRAVACHOL) 10 MG tablet Take 10 mg by mouth daily.    [provider]  predniSONE (DELTASONE) 10 MG tablet Take 6 tablets day one, 5 tablets day two, 4 tablets day three, 3 tablets day four, 2 tablets day five, then 1 tablet day six 07/23/21   Triplett, Tammy, PA-C  QUEtiapine (SEROQUEL) 200 MG tablet Take 200 mg by mouth 2 (two) times daily.    [provider]  traZODone (DESYREL) 50 MG tablet Take 50 mg by mouth at bedtime.    [provider]      Allergies    Contrast media [iodinated contrast media]    Review of Systems   Review of Systems  Musculoskeletal:  Positive for myalgias.  All other systems reviewed and are negative.   Physical Exam Updated Vital Signs BP (!) 131/98   Pulse 80   Temp 98 F (36.7 C) (Oral)   Resp 16   Ht 5\' 10"  (1.778 m)   Wt 66.7 kg   SpO2 98%   BMI 21.09 kg/m  Physical Exam Vitals and nursing note reviewed.  Constitutional:      General: He is not in acute distress.    Appearance: Normal appearance. He is well-developed. He is not ill-appearing, toxic-appearing or diaphoretic.     Comments: Resting comfortably in bed  HENT:     Head: Normocephalic and atraumatic.  Eyes:  Conjunctiva/sclera: Conjunctivae normal.  Cardiovascular:     Rate and Rhythm: Normal rate and regular rhythm.     Heart sounds: No murmur heard. Pulmonary:     Effort: Pulmonary effort is normal. No respiratory distress.     Breath sounds: Normal breath sounds. No stridor. No wheezing, rhonchi or rales.  Abdominal:     Palpations: Abdomen is soft.     Tenderness: There is no abdominal tenderness. There is no guarding.  Musculoskeletal:        General: No swelling.     Cervical back: Neck supple.     Right lower leg: No edema.     Left lower leg: No edema.     Comments: TTP of the right upper chest and shoulder  Skin:    General: Skin is warm and dry.     Capillary Refill: Capillary refill takes less than 2 seconds.      Findings: No rash.  Neurological:     General: No focal deficit present.     Mental Status: He is alert and oriented to person, place, and time.  Psychiatric:        Mood and Affect: Mood normal.     ED Results / Procedures / Treatments   Labs (all labs ordered are listed, but only abnormal results are displayed) Labs Reviewed  CBC WITH DIFFERENTIAL/PLATELET - Abnormal; Notable for the following components:      Result Value   RDW 16.4 (*)    All other components within normal limits  BASIC METABOLIC PANEL - Abnormal; Notable for the following components:   Sodium 134 (*)    All other components within normal limits  TROPONIN I (HIGH SENSITIVITY)    EKG EKG Interpretation  Date/Time:  Friday May 07 2022 20:36:05 EDT Ventricular Rate:  91 PR Interval:  148 QRS Duration: 90 QT Interval:  362 QTC Calculation: 445 R Axis:   81 Text Interpretation: Normal sinus rhythm Right atrial enlargement Minimal voltage criteria for LVH, may be normal variant ( Sokolow-Lyon ) Nonspecific ST and T wave abnormality Abnormal ECG When compared with ECG of 08-Oct-2019 21:14, T wave inversion now evident in Inferior leads Nonspecific T wave abnormality now evident in Lateral leads Confirmed by Isla Pence [phone]) on 05/07/2022 8:59:38 PM  Radiology DG Chest 2 View  Result Date: 05/07/2022 CLINICAL DATA:  Right-sided chest pain EXAM: CHEST - 2 VIEW COMPARISON:  10/08/2019 FINDINGS: The heart size and mediastinal contours are within normal limits. Both lungs are clear. The visualized skeletal structures are unremarkable. IMPRESSION: No active cardiopulmonary disease. Electronically Signed   By: Donavan Foil M.D.   On: 05/07/2022 21:27    Procedures Procedures    Medications Ordered in ED Medications - No data to display  ED Course/ Medical Decision Making/ A&P                             Medical Decision Making Amount and/or Complexity of Data Reviewed Labs: ordered. Radiology:  ordered.  This patient presents to the ED for concern of hypertension, this involves an extensive number of treatment options, and is a complaint that carries with it a high risk of complications and morbidity.  The differential diagnosis for hypertension includes but is not limited to hypertensive emergency, hypertensive urgency, stroke, sympathomimetic ingestion, acute pulmonary edema, ischemic stroke, intracranial hemorrhage, preeclampsia or eclampsia, autonomic dysreflexia, acute glomerulonephritis, type I MI, volume overload, urinary obstruction, pain, renal artery stenosis, polycystic  kidney disease, Cushing syndrome, OSA, pheochromocytoma, hyperaldosteronism, hypothyroidism, anxiety  Co morbidities that complicate the patient evaluation  hypertension, bipolar 1, PTSD, seizures, GERD, substance abuse  My initial workup includes ACS rule out  Additional history obtained from: Nursing notes from this visit.  I ordered, reviewed and interpreted labs which include:BMP, CBC, troponin  I ordered imaging studies including chest x-ray I independently visualized and interpreted imaging which showed normal I agree with the radiologist interpretation  Cardiac Monitoring:  The patient was maintained on a cardiac monitor.  I personally viewed and interpreted the cardiac monitored which showed an underlying rhythm of: NSR  Afebrile, initially hypertensive which improved in the ED without intervention and remains outside of hypertensive emergency range.  62 year old male presents ED for evaluation of high blood pressure readings at home.  Has not taken his blood pressure medication in the last 2 weeks.  He typically gets this filled through the [facility] but was unable to get it filled last time.  His physical exam is remarkable for tenderness to palpation of the right upper chest and shoulder where he reports his pain is.  Examination is otherwise reassuring.  Lab workup is reassuring and shows no evidence  of endorgan dysfunction.  EKG does show some T wave inversions which were not present on previous EKG which was greater than 2 years ago.  Given reassuring exam and troponin, low suspicion for hypertensive emergency causing cardiac disease.  Cardiology referral was placed.  Patient was encouraged to return to the [facility] to refill his antihypertensives.  Right upper chest and shoulder pain likely musculoskeletal in nature.  He was given return precautions.  Stable at discharge.  At this time there does not appear to be any evidence of an acute emergency medical condition and the patient appears stable for discharge with appropriate outpatient follow up. Diagnosis was discussed with patient who verbalizes understanding of care plan and is agreeable to discharge. I have discussed return precautions with patient who verbalizes understanding. Patient encouraged to follow-up with their PCP within 1 week. All questions answered.  Patient's case discussed with Dr. Gilford Raid who agrees with plan to discharge with follow-up.   Note: Portions of this report may have been transcribed using voice recognition software. Every effort was made to ensure accuracy; however, inadvertent computerized transcription errors may still be present.        Final Clinical Impression(s) / ED Diagnoses Final diagnoses:  Uncontrolled hypertension  Acute pain of right shoulder  Atypical chest pain    Rx / DC Orders ED Discharge Orders          Ordered    Ambulatory referral to Cardiology       Comments: If you have not heard from the Cardiology office within the next 72 hours please call [phone].   05/07/22 2203              Roylene Reason, PA-C 05/07/22 2216    Isla Pence, MD 05/07/22 2230

## 2022-05-07 NOTE — Discharge Instructions (Signed)
You have been seen today for your complaint of high blood pressure reading, right shoulder pain. Your lab work was reassuring and showed no abnormalities. Your imaging was reassuring and showed no abnormalities. Your discharge medications include your home medications.  Call to schedule an appointment to get your medications refilled. Follow up with: Your primary care provider soon as possible for reevaluation of your blood pressure.  You should also follow-up with cardiology.  They will call you to schedule an appointment in the next 72 hours Please seek immediate medical care if you develop any of the following symptoms: Develop a severe headache or confusion. Have unusual weakness or numbness. Feel faint. Have severe pain in your chest or abdomen. Vomit repeatedly. Have trouble breathing. At this time there does not appear to be the presence of an emergent medical condition, however there is always the potential for conditions to change. Please read and follow the below instructions.  Do not take your medicine if  develop an itchy rash, swelling in your mouth or lips, or difficulty breathing; call 911 and seek immediate emergency medical attention if this occurs.  You may review your lab tests and imaging results in their entirety on your MyChart account.  Please discuss all results of fully with your primary care provider and other specialist at your follow-up visit.  Note: Portions of this text may have been transcribed using voice recognition software. Every effort was made to ensure accuracy; however, inadvertent computerized transcription errors may still be present.

## 2022-10-16 ENCOUNTER — Encounter (HOSPITAL_COMMUNITY): Payer: Self-pay

## 2022-10-16 ENCOUNTER — Other Ambulatory Visit: Payer: Self-pay

## 2022-10-16 ENCOUNTER — Emergency Department (HOSPITAL_COMMUNITY)
Admission: EM | Admit: 2022-10-16 | Discharge: 2022-10-16 | Disposition: A | Discharge status: Home / Self Care | Payer: 59 | Source: Home / Self Care | Attending: Emergency Medicine | Admitting: Emergency Medicine

## 2022-10-16 ENCOUNTER — Emergency Department (HOSPITAL_COMMUNITY): Payer: 59

## 2022-10-16 DIAGNOSIS — R519 Headache, unspecified: Secondary | ICD-10-CM | POA: Diagnosis present

## 2022-10-16 DIAGNOSIS — F17210 Nicotine dependence, cigarettes, uncomplicated: Secondary | ICD-10-CM | POA: Diagnosis not present

## 2022-10-16 DIAGNOSIS — I10 Essential (primary) hypertension: Secondary | ICD-10-CM | POA: Insufficient documentation

## 2022-10-16 DIAGNOSIS — Z79899 Other long term (current) drug therapy: Secondary | ICD-10-CM | POA: Diagnosis not present

## 2022-10-16 LAB — CBC
HCT: 38.6 % — ABNORMAL LOW (ref 39.0–52.0)
Hemoglobin: 13 g/dL (ref 13.0–17.0)
MCH: 27.8 pg (ref 26.0–34.0)
MCHC: 33.7 g/dL (ref 30.0–36.0)
MCV: 82.5 fL (ref 80.0–100.0)
Platelets: 287 10*3/uL (ref 150–400)
RBC: 4.68 MIL/uL (ref 4.22–5.81)
RDW: 15.9 % — ABNORMAL HIGH (ref 11.5–15.5)
WBC: 9 10*3/uL (ref 4.0–10.5)
nRBC: 0 % (ref 0.0–0.2)

## 2022-10-16 LAB — BASIC METABOLIC PANEL
Anion gap: 6 (ref 5–15)
BUN: 13 mg/dL (ref 6–20)
CO2: 29 mmol/L (ref 22–32)
Calcium: 9.4 mg/dL (ref 8.9–10.3)
Chloride: 99 mmol/L (ref 98–111)
Creatinine, Ser: 0.94 mg/dL (ref 0.61–1.24)
GFR, Estimated: >60 mL/min (ref >60)
Glucose, Bld: 87 mg/dL (ref 70–99)
Potassium: 3.6 mmol/L (ref 3.5–5.1)
Sodium: 134 mmol/L — ABNORMAL LOW (ref 135–145)

## 2022-10-16 MED ORDER — HYDRALAZINE HCL 10 MG PO TABS: 10.0000 mg | ORAL_TABLET | Freq: Three times a day (TID) | ORAL | 0 refills | Status: AC | PRN

## 2022-10-16 MED ORDER — ACETAMINOPHEN 500 MG PO TABS
1000.0000 mg | ORAL_TABLET | Freq: Once | ORAL | Status: AC
  Administered 2022-10-16: 1000 mg via ORAL
  Filled 2022-10-16: qty 2

## 2022-10-16 NOTE — ED Triage Notes (Signed)
Pt reports he felt like his face was twitching so he checked his BP and it was elevated.

## 2022-10-16 NOTE — ED Provider Notes (Signed)
[facility] EMERGENCY DEPARTMENT AT [facility] Provider Note   CSN: [id number] Arrival date & time: 10/16/22  7829     History  Chief Complaint  Patient presents with   Hypertension    Victor Palmer is a 62 y.o. male.   Hypertension   62 year old male presents emergency department with complaints of headache, elevated blood pressure.  Patient states that he woke up this morning with headache in the back of his head.  Reports getting daily headaches but this 1 has lasted a little bit more than normal.  States he took an aspirin without relief.  When his headache would not go away, took his blood pressure and it was in the 200s over 100s prompting visit the emergency department.  States he is on lisinopril as well as amlodipine for management of his blood pressure and has not missed any doses recently.  States that his blood pressure has gradually gone down since checking it earlier this morning.  Presents emergency department due to concerns for elevated blood pressure with accompanying headache.  Denies any visual disturbance, gait abnormality, slurred speech, facial droop, weakness/sensory deficits in upper or lower extremities.  Denies any fever chills, chest pain, shortness of breath, nausea, vomiting.  Past medical history significant for hypertension on lisinopril/amlodipine, bipolar 1 disorder, GERD, chronic back pain, hypercholesterolemia, seizure, substance abuse, diabetes mellitus type 2  Home Medications Prior to Admission medications   Medication Sig Start Date End Date Taking? Authorizing Provider  hydrALAZINE (APRESOLINE) 10 MG tablet Take 1 tablet (10 mg total) by mouth 3 (three) times daily as needed. Use as needed for persistent Systolic BP readings over 165 5/62/13  Yes Sherian Maroon A, PA  benztropine (COGENTIN) 1 MG tablet Take 1 mg by mouth 2 (two) times daily.    [provider]  DULoxetine (CYMBALTA) 30 MG capsule Take 30 mg by mouth daily.     [provider]  gabapentin (NEURONTIN) 300 MG capsule Take 300 mg by mouth 3 (three) times daily.    [provider]  HYDROcodone-acetaminophen (NORCO/VICODIN) 5-325 MG tablet Take one tab po q 4 hrs prn pain 07/23/21   Triplett, Tammy, PA-C  lisinopril (ZESTRIL) 40 MG tablet Take 1 tablet (40 mg total) by mouth daily. 09/26/19 11/25/19  Walisiewicz, Caroleen Hamman, PA-C  methocarbamol (ROBAXIN) 500 MG tablet Take 1 tablet (500 mg total) by mouth 3 (three) times daily. 07/23/21   Triplett, Tammy, PA-C  pravastatin (PRAVACHOL) 10 MG tablet Take 10 mg by mouth daily.    [provider]  predniSONE (DELTASONE) 10 MG tablet Take 6 tablets day one, 5 tablets day two, 4 tablets day three, 3 tablets day four, 2 tablets day five, then 1 tablet day six 07/23/21   Triplett, Tammy, PA-C  QUEtiapine (SEROQUEL) 200 MG tablet Take 200 mg by mouth 2 (two) times daily.    [provider]  traZODone (DESYREL) 50 MG tablet Take 50 mg by mouth at bedtime.    [provider]      Allergies    Contrast media [iodinated contrast media]    Review of Systems   Review of Systems  All other systems reviewed and are negative.   Physical Exam Updated Vital Signs BP (!) 149/87 (BP Location: Left Arm)   Pulse 60   Temp 98.7 F (37.1 C) (Oral)   Resp 18   Ht 5\' 10"  (1.778 m)   Wt 68 kg   SpO2 99%   BMI 21.52 kg/m  Physical Exam Vitals and nursing note reviewed.  Constitutional:      General: He is not in acute distress.    Appearance: He is well-developed.  HENT:     Head: Normocephalic and atraumatic.  Eyes:     Conjunctiva/sclera: Conjunctivae normal.  Cardiovascular:     Rate and Rhythm: Normal rate and regular rhythm.     Heart sounds: No murmur heard. Pulmonary:     Effort: Pulmonary effort is normal. No respiratory distress.     Breath sounds: Normal breath sounds.  Abdominal:     Palpations: Abdomen is soft.     Tenderness: There is no abdominal tenderness.   Musculoskeletal:        General: No swelling.     Cervical back: Neck supple.  Skin:    General: Skin is warm and dry.     Capillary Refill: Capillary refill takes less than 2 seconds.  Neurological:     Mental Status: He is alert.     Comments: Alert and oriented to self, place, time and event.   Speech is fluent, clear without dysarthria or dysphasia.   Strength 5/5 in upper/lower extremities   Sensation intact in upper/lower extremities   Normal gait.  CN I not tested  CN II not tested CN III, IV, VI PERRLA and EOMs intact bilaterally  CN V Intact sensation to sharp and light touch to the face  CN VII facial movements symmetric  CN VIII not tested  CN IX, X no uvula deviation, symmetric rise of soft palate  CN XI 5/5 SCM and trapezius strength bilaterally  CN XII Midline tongue protrusion, symmetric L/R movements     Psychiatric:        Mood and Affect: Mood normal.     ED Results / Procedures / Treatments   Labs (all labs ordered are listed, but only abnormal results are displayed) Labs Reviewed  BASIC METABOLIC PANEL - Abnormal; Notable for the following components:      Result Value   Sodium 134 (*)    All other components within normal limits  CBC - Abnormal; Notable for the following components:   HCT 38.6 (*)    RDW 15.9 (*)    All other components within normal limits    EKG None  Radiology CT Head Wo Contrast  Result Date: 10/16/2022 CLINICAL DATA:  62 year old male with headache. Facial twitching. Hypertensive. EXAM: CT HEAD WITHOUT CONTRAST TECHNIQUE: Contiguous axial images were obtained from the base of the skull through the vertex without intravenous contrast. RADIATION DOSE REDUCTION: This exam was performed according to the departmental dose-optimization program which includes automated exposure control, adjustment of the mA and/or kV according to patient size and/or use of iterative reconstruction technique. COMPARISON:  Brain MRI 12/28/2012.   Head CT 09/29/2004. FINDINGS: Brain: Cerebral volume is within normal limits for age. No midline shift, ventriculomegaly, mass effect, evidence of mass lesion, intracranial hemorrhage or evidence of cortically based acute infarction. Mild for age white matter hypodensity more pronounced in the posterior left frontal lobe, probably stable from the 2014 MRI. No cortical encephalomalacia identified. Vascular: No suspicious intracranial vascular hyperdensity. Skull: Stable and intact. Sinuses/Orbits: Visualized paranasal sinuses and mastoids are stable and well aerated. Other: Chronic right convexity scalp soft tissue scarring. No acute orbit or scalp soft tissue finding. IMPRESSION: 1. No acute intracranial abnormality. 2. Mild for age white matter changes, nonspecific but most commonly due to small vessel disease. Electronically Signed   By: Althea Grimmer.D.  On: 10/16/2022 10:22    Procedures Procedures    Medications Ordered in ED Medications  acetaminophen (TYLENOL) tablet 1,000 mg (1,000 mg Oral Given 10/16/22 1005)    ED Course/ Medical Decision Making/ A&P                                 Medical Decision Making Amount and/or Complexity of Data Reviewed Labs: ordered. Radiology: ordered.  Risk OTC drugs. Prescription drug management.   This patient presents to the ED for concern of elevated blood pressure, headache, this involves an extensive number of treatment options, and is a complaint that carries with it a high risk of complications and morbidity.  The differential diagnosis includes CVA, migraine/tension/cluster headache, SAH, hypertension   Co morbidities that complicate the patient evaluation  See HPI   Additional history obtained:  Additional history obtained from EMR External records from outside source obtained and reviewed including hospital records   Lab Tests:  I Ordered, and personally interpreted labs.  The pertinent results include: Mild hyponatremia of 134  but otherwise, electrolytes within the limits.  No renal dysfunction.  No leukocytosis.  No evidence of anemia.  Platelets within range.   Imaging Studies ordered:  I ordered imaging studies including CT head I independently visualized and interpreted imaging which showed no acute intracranial abnormality.  Mild for age white matter disease I agree with the radiologist interpretation   Cardiac Monitoring: / EKG:  The patient was maintained on a cardiac monitor.  I personally viewed and interpreted the cardiac monitored which showed an underlying rhythm of: Sinus rhythm   Consultations Obtained:  N/a   Problem List / ED Course / Critical interventions / Medication management  Elevated blood pressure, headache I ordered medication including Tylenol   Reevaluation of the patient after these medicines showed that the patient improved I have reviewed the patients home medicines and have made adjustments as needed   Social Determinants of Health:  Chronic cigarette use.   Test / Admission - Considered:  Elevated blood pressure, headache Vitals signs significant for initial blood pressure 155/87. Otherwise within normal range and stable throughout visit. Laboratory/imaging studies significant for: See above 62 year old male presents emergency department with complaints of posterior headache as well as elevated blood pressure reading.  Patient reportedly had at home blood pressure reading of 200 systolic over 100 despite being compliant with his at home lisinopril/amlodipine.  On exam, patient without any acute neurologic deficits with nonfocal neurologic exam.  Patient's blood pressure while in the ED and 150 systolic over 80s.  Given reported elevated blood pressure reading at home as well as concurrent headache, CT imaging of head as well as laboratory studies were performed.  CT imaging without any acute intracranial abnormality.  Patient did note improvement of headache with  Tylenol administered emergency department.  Low suspicion for CVA.  Patient without evidence of AKI.  EKG without ischemic changes.  Will recommend follow-up with primary care for further blood pressure medication modifications as well as monitoring blood pressure at home.  Will prescribe hydralazine to take as needed for blood pressure readings persistently over 165 systolic.  Treatment plan discussed with patient and he acknowledged understanding was agreeable to said plan.  Patient overall well-appearing, afebrile in no acute distress. Worrisome signs and symptoms were discussed with the patient, and the patient acknowledged understanding to return to the ED if noticed. Patient was stable upon discharge. '  Final Clinical Impression(s) / ED Diagnoses Final diagnoses:  Hypertension, unspecified type  Acute nonintractable headache, unspecified headache type    Rx / DC Orders ED Discharge Orders          Ordered    hydrALAZINE (APRESOLINE) 10 MG tablet  3 times daily PRN        10/16/22 1028              Peter Garter, [place] 10/16/22 1142    Bethann Berkshire, MD 10/16/22 1702

## 2022-10-16 NOTE — Discharge Instructions (Addendum)
As discussed, workup today overall reassuring.  CT imaging was negative for any abnormality.  Does not appear like you are having a stroke.  Will recommend continued use of your blood pressure medications in the form of lisinopril and amlodipine.  Will send a medication to take for elevated blood pressure readings over 165 systolic.  Follow-up with your primary care for further management/assessment of your blood pressure medications.  Please do not hesitate to return to emergency department for worrisome signs and symptoms we discussed become apparent.

## 2022-11-22 ENCOUNTER — Emergency Department (HOSPITAL_COMMUNITY)
Admission: EM | Admit: 2022-11-22 | Discharge: 2022-11-22 | Disposition: A | Discharge status: Home / Self Care | Payer: 59 | Attending: Emergency Medicine | Admitting: Emergency Medicine

## 2022-11-22 ENCOUNTER — Encounter (HOSPITAL_COMMUNITY): Payer: Self-pay | Admitting: *Deleted

## 2022-11-22 ENCOUNTER — Other Ambulatory Visit: Payer: Self-pay

## 2022-11-22 DIAGNOSIS — R451 Restlessness and agitation: Secondary | ICD-10-CM | POA: Insufficient documentation

## 2022-11-22 LAB — CBC WITH DIFFERENTIAL/PLATELET
Abs Immature Granulocytes: 0.02 10*3/uL (ref 0.00–0.07)
Basophils Absolute: 0 10*3/uL (ref 0.0–0.1)
Basophils Relative: 0 %
Eosinophils Absolute: 0.1 10*3/uL (ref 0.0–0.5)
Eosinophils Relative: 1 %
HCT: 38 % — ABNORMAL LOW (ref 39.0–52.0)
Hemoglobin: 12.8 g/dL — ABNORMAL LOW (ref 13.0–17.0)
Immature Granulocytes: 0 %
Lymphocytes Relative: 37 %
Lymphs Abs: 3.5 10*3/uL (ref 0.7–4.0)
MCH: 27.9 pg (ref 26.0–34.0)
MCHC: 33.7 g/dL (ref 30.0–36.0)
MCV: 82.8 fL (ref 80.0–100.0)
Monocytes Absolute: 0.5 10*3/uL (ref 0.1–1.0)
Monocytes Relative: 5 %
Neutro Abs: 5.5 10*3/uL (ref 1.7–7.7)
Neutrophils Relative %: 57 %
Platelets: 287 10*3/uL (ref 150–400)
RBC: 4.59 MIL/uL (ref 4.22–5.81)
RDW: 16.2 % — ABNORMAL HIGH (ref 11.5–15.5)
WBC: 9.7 10*3/uL (ref 4.0–10.5)
nRBC: 0 % (ref 0.0–0.2)

## 2022-11-22 LAB — ACETAMINOPHEN LEVEL: Acetaminophen (Tylenol), Serum: <10 ug/mL — ABNORMAL LOW (ref 10–30)

## 2022-11-22 LAB — COMPREHENSIVE METABOLIC PANEL
ALT: 25 U/L (ref 0–44)
AST: 24 U/L (ref 15–41)
Albumin: 3.9 g/dL (ref 3.5–5.0)
Alkaline Phosphatase: 81 U/L (ref 38–126)
Anion gap: 8 (ref 5–15)
BUN: 16 mg/dL (ref 6–20)
CO2: 28 mmol/L (ref 22–32)
Calcium: 9.4 mg/dL (ref 8.9–10.3)
Chloride: 102 mmol/L (ref 98–111)
Creatinine, Ser: 1.08 mg/dL (ref 0.61–1.24)
GFR, Estimated: >60 mL/min (ref >60)
Glucose, Bld: 124 mg/dL — ABNORMAL HIGH (ref 70–99)
Potassium: 3.6 mmol/L (ref 3.5–5.1)
Sodium: 138 mmol/L (ref 135–145)
Total Bilirubin: 0.6 mg/dL (ref 0.3–1.2)
Total Protein: 6.8 g/dL (ref 6.5–8.1)

## 2022-11-22 LAB — SALICYLATE LEVEL: Salicylate Lvl: <7 mg/dL — ABNORMAL LOW (ref 7.0–30.0)

## 2022-11-22 LAB — ETHANOL: Alcohol, Ethyl (B): <10 mg/dL (ref <10)

## 2022-11-22 MED ORDER — LORAZEPAM 1 MG PO TABS
1.0000 mg | ORAL_TABLET | Freq: Once | ORAL | Status: AC
  Administered 2022-11-22: 1 mg via ORAL
  Filled 2022-11-22: qty 1

## 2022-11-22 NOTE — ED Notes (Signed)
Patient changed into burgundy scrubs. cigarettes, lighter, hat, cell, and keys placed in patient bag labeled and then placed in locker 12. Pt is voluntary and can have his cell phone and glasses as he stated to the PA-C he would like to keep them on him, endorsed that to RN as well

## 2022-11-22 NOTE — ED Provider Notes (Signed)
[place] EMERGENCY DEPARTMENT AT [facility] Provider Note   CSN: [id number] Arrival date & time: 11/22/22  1524     History  Chief Complaint  Patient presents with   V70.1    Victor Palmer is a 62 y.o. male.  Patient to ED with "mental health problems". 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 He is vague about his symptoms stating only that he has mental health issues and needs to be hospitalized.   The history is provided by the patient. No language interpreter was used.       Home Medications Prior to Admission medications   Medication Sig Start Date End Date Taking? Authorizing Provider  benztropine (COGENTIN) 1 MG tablet Take 1 mg by mouth 2 (two) times daily.    [provider]  DULoxetine (CYMBALTA) 30 MG capsule Take 30 mg by mouth daily.    [provider]  gabapentin (NEURONTIN) 300 MG capsule Take 300 mg by mouth 3 (three) times daily.    [provider]  hydrALAZINE (APRESOLINE) 10 MG tablet Take 1 tablet (10 mg total) by mouth 3 (three) times daily as needed. Use as needed for persistent Systolic BP readings over 165 8/41/32   Sherian Maroon A, [place]  HYDROcodone-acetaminophen (NORCO/VICODIN) 5-325 MG tablet Take one tab po q 4 hrs prn pain 07/23/21   Triplett, Tammy, PA-C  lisinopril (ZESTRIL) 40 MG tablet Take 1 tablet (40 mg total) by mouth daily. 09/26/19 11/25/19  Walisiewicz, Caroleen Hamman, PA-C  methocarbamol (ROBAXIN) 500 MG tablet Take 1 tablet (500 mg total) by mouth 3 (three) times daily. 07/23/21   Triplett, Tammy, PA-C  pravastatin (PRAVACHOL) 10 MG tablet Take 10 mg by mouth daily.    [provider]  predniSONE (DELTASONE) 10 MG tablet Take 6 tablets day one, 5 tablets day two, 4 tablets day three, 3 tablets day four, 2 tablets day five, then 1 tablet day six 07/23/21   Triplett, Tammy, PA-C  QUEtiapine  (SEROQUEL) 200 MG tablet Take 200 mg by mouth 2 (two) times daily.    [provider]  traZODone (DESYREL) 50 MG tablet Take 50 mg by mouth at bedtime.    [provider]      Allergies    Contrast media [iodinated contrast media]    Review of Systems   Review of Systems  Physical Exam Updated Vital Signs BP (!) 140/104   Pulse (!) 101   Temp 99 F (37.2 C) (Oral)   Resp 16   Ht 5\' 10"  (1.778 m)   Wt 65.8 kg   SpO2 99%   BMI 20.81 kg/m  Physical Exam Vitals and nursing note reviewed.  Constitutional:      Appearance: Normal appearance.  HENT:     Head: Atraumatic.  Cardiovascular:     Rate and Rhythm: Normal rate and regular rhythm.     Heart sounds: No murmur heard. Pulmonary:     Effort: Pulmonary effort is normal.     Breath sounds: No wheezing, rhonchi or rales.  Abdominal:     General: There is no distension.  Neurological:     Mental Status: He is alert.  Psychiatric:        Mood and Affect: Affect is angry.        Behavior: Behavior is agitated.     ED Results / Procedures /  Treatments   Labs (all labs ordered are listed, but only abnormal results are displayed) Labs Reviewed  CBC WITH DIFFERENTIAL/PLATELET - Abnormal; Notable for the following components:      Result Value   Hemoglobin 12.8 (*)    HCT 38.0 (*)    RDW 16.2 (*)    All other components within normal limits  SALICYLATE LEVEL  ETHANOL  COMPREHENSIVE METABOLIC PANEL  ACETAMINOPHEN LEVEL  RAPID URINE DRUG SCREEN, HOSP PERFORMED    EKG None  Radiology No results found.  Procedures Procedures    Medications Ordered in ED Medications  LORazepam (ATIVAN) tablet 1 mg (1 mg Oral Given 11/22/22 1702)    ED Course/ Medical Decision Making/ A&P Clinical Course as of 11/22/22 1742  Mon Nov 22, 2022  1654 Patient presents for "mental health" issues. He is vague about specifics of his problem. Chart reviewed. He contacted the [facility] Hotline today who  ultimately determined that the patient was safe to follow up with plan to come to [facility] [place] facility tomorrow morning. The patient reports he came here with the misunderstanding that we could transport him there today. He agrees to allow medical clearance and psychiatric evaluation here. He is overall cooperative but is easily agitated. Ativan ordered to help which he agrees to.  Since he is not endorsing SI or HI, and deemed safe for outpatient follow up with the [facility] tomorrow, IVC is not felt appropriate. If he wants to leave at any point, he is able. Labs and TTS consult ordered. [SU]  1735 Patient reports his sister will take him to the [place] today to get the help he needs. He is adamant he wants to receive care at the [facility] where is well established. He can be discharged for POV transportation to the [place] with his sister.  [SU]  1741 On final discussion, the patient denies SI or HI. He feels he is getting increasingly frustrated and just wants treatment at the [facility].  [SU]    Clinical Course User Index [SU] Elpidio Anis, PA-C                                 Medical Decision Making Amount and/or Complexity of Data Reviewed Labs: ordered.  Risk Prescription drug management.           Final Clinical Impression(s) / ED Diagnoses Final diagnoses:  Agitation    Rx / DC Orders ED Discharge Orders     None         Danne Harbor 11/22/22 1742    Gloris Manchester, MD 11/22/22 2342

## 2022-11-22 NOTE — ED Triage Notes (Signed)
Pt with having "bad thoughts".  Pt will not answer the question if having suicidal thoughts.

## 2022-11-22 NOTE — ED Notes (Addendum)
Pt states "my sister said she was going to drive me to the [place], I am ready to leave and I just want my things--PA-C made aware and PA-C states pt can be safely discharged as pt does not specifically endorse SI . Pts belongings given back to pt

## 2022-11-22 NOTE — Discharge Instructions (Signed)
You have states you are not suicidal. You have also said that you are not thinking of hurting others. You can be discharged from this ED and are encouraged to go to the [facility] as planned for further evaluation and treatment.

## 2023-06-05 ENCOUNTER — Encounter (HOSPITAL_COMMUNITY): Payer: Self-pay

## 2023-06-05 ENCOUNTER — Emergency Department (HOSPITAL_COMMUNITY)

## 2023-06-05 ENCOUNTER — Emergency Department (HOSPITAL_COMMUNITY)
Admission: EM | Admit: 2023-06-05 | Discharge: 2023-06-05 | Disposition: A | Discharge status: Home / Self Care | Attending: Emergency Medicine | Admitting: Emergency Medicine

## 2023-06-05 ENCOUNTER — Other Ambulatory Visit: Payer: Self-pay

## 2023-06-05 DIAGNOSIS — R519 Headache, unspecified: Secondary | ICD-10-CM | POA: Diagnosis present

## 2023-06-05 DIAGNOSIS — I10 Essential (primary) hypertension: Secondary | ICD-10-CM | POA: Diagnosis not present

## 2023-06-05 DIAGNOSIS — Z79899 Other long term (current) drug therapy: Secondary | ICD-10-CM | POA: Insufficient documentation

## 2023-06-05 DIAGNOSIS — E119 Type 2 diabetes mellitus without complications: Secondary | ICD-10-CM | POA: Insufficient documentation

## 2023-06-05 DIAGNOSIS — G44209 Tension-type headache, unspecified, not intractable: Secondary | ICD-10-CM | POA: Insufficient documentation

## 2023-06-05 LAB — BASIC METABOLIC PANEL WITH GFR
Anion gap: 6 (ref 5–15)
BUN: 14 mg/dL (ref 8–23)
CO2: 28 mmol/L (ref 22–32)
Calcium: 9.5 mg/dL (ref 8.9–10.3)
Chloride: 99 mmol/L (ref 98–111)
Creatinine, Ser: 1.16 mg/dL (ref 0.61–1.24)
GFR, Estimated: >60 mL/min (ref >60)
Glucose, Bld: 79 mg/dL (ref 70–99)
Potassium: 4 mmol/L (ref 3.5–5.1)
Sodium: 133 mmol/L — ABNORMAL LOW (ref 135–145)

## 2023-06-05 LAB — CBC
HCT: 40.8 % (ref 39.0–52.0)
Hemoglobin: 13.6 g/dL (ref 13.0–17.0)
MCH: 28.2 pg (ref 26.0–34.0)
MCHC: 33.3 g/dL (ref 30.0–36.0)
MCV: 84.6 fL (ref 80.0–100.0)
Platelets: 306 10*3/uL (ref 150–400)
RBC: 4.82 MIL/uL (ref 4.22–5.81)
RDW: 16 % — ABNORMAL HIGH (ref 11.5–15.5)
WBC: 9.2 10*3/uL (ref 4.0–10.5)
nRBC: 0 % (ref 0.0–0.2)

## 2023-06-05 MED ORDER — AMLODIPINE BESYLATE 10 MG PO TABS: 10.0000 mg | ORAL_TABLET | Freq: Every day | ORAL | 0 refills | Status: AC

## 2023-06-05 NOTE — ED Triage Notes (Signed)
 Pt states was waking up with headaches every morning for the past month. Pt states he started checking his blood pressure for a week now to see if that's what is causing his headaches. Pt states that his reading have still been high. Pt states highest BP reading at home has been 190/102

## 2023-06-05 NOTE — ED Provider Notes (Signed)
 [place] EMERGENCY DEPARTMENT AT [facility] Provider Note   CSN: [id number] Arrival date & time: 06/05/23  1010     History {Add pertinent medical, surgical, social history, OB history to HPI:1} Chief Complaint  Patient presents with   Hypertension    Victor Palmer is a 63 y.o. male with a history including type 2 diabetes, hypertension, seizure disorder, PTSD and bipolar disorder who is a patient of the [place] on multiple medications for blood pressure including lisinopril , amlodipine  and as needed hydralazine  presenting for evaluation of a 1 month history of persistent daily headache along with elevation in blood pressures.  He describes waking every day with a headache, it has resolved by midday, however for the past week it has been constant.  It is not associated with photophobia, focal weakness, dizziness, vision changes, chest pain, shortness of breath, he also denies nausea or vomiting.  He has been checking his blood pressures more frequently at a concern that the blood pressures causing the headache, and has been getting elevated numbers, largest pressure reading was 197/102 this past week.  He has reached out to his primary provider but is still awaiting feedback from them.  He denies focal weakness.  Denies neck pain or stiffness.  He is careful with his diet, low-salt, he was taken ibuprofen  400 mg for chronic back pain but has stopped taking this medication.  The history is provided by the patient.       Home Medications Prior to Admission medications   Medication Sig Start Date End Date Taking? Authorizing Provider  benztropine (COGENTIN) 1 MG tablet Take 1 mg by mouth 2 (two) times daily.    [provider]  DULoxetine (CYMBALTA) 30 MG capsule Take 30 mg by mouth daily.    [provider]  gabapentin (NEURONTIN) 300 MG capsule Take 300 mg by mouth 3 (three) times daily.    [provider]  hydrALAZINE  (APRESOLINE ) 10 MG tablet  Take 1 tablet (10 mg total) by mouth 3 (three) times daily as needed. Use as needed for persistent Systolic BP readings over 165 7/82/95   Neil Balls A, [place]  HYDROcodone -acetaminophen  (NORCO/VICODIN) 5-325 MG tablet Take one tab po q 4 hrs prn pain 07/23/21   Triplett, Tammy, PA-C  lisinopril  (ZESTRIL ) 40 MG tablet Take 1 tablet (40 mg total) by mouth daily. 09/26/19 11/25/19  Walisiewicz, Kaitlyn E, PA-C  methocarbamol  (ROBAXIN ) 500 MG tablet Take 1 tablet (500 mg total) by mouth 3 (three) times daily. 07/23/21   Triplett, Tammy, PA-C  pravastatin (PRAVACHOL) 10 MG tablet Take 10 mg by mouth daily.    [provider]  predniSONE  (DELTASONE ) 10 MG tablet Take 6 tablets day one, 5 tablets day two, 4 tablets day three, 3 tablets day four, 2 tablets day five, then 1 tablet day six 07/23/21   Triplett, Tammy, PA-C  QUEtiapine (SEROQUEL) 200 MG tablet Take 200 mg by mouth 2 (two) times daily.    [provider]  traZODone (DESYREL) 50 MG tablet Take 50 mg by mouth at bedtime.    [provider]      Allergies    Contrast media [iodinated contrast media]    Review of Systems   Review of Systems  Constitutional:  Negative for fever.  HENT:  Negative for congestion.   Eyes: Negative.  Negative for visual disturbance.  Respiratory:  Negative for chest tightness and shortness of breath.   Cardiovascular:  Negative for chest pain.  Gastrointestinal:  Negative for abdominal pain and nausea.  Genitourinary: Negative.   Musculoskeletal:  Negative for arthralgias, joint swelling and neck pain.  Skin: Negative.  Negative for rash and wound.  Neurological:  Positive for headaches. Negative for dizziness, speech difficulty, weakness, light-headedness and numbness.  Psychiatric/Behavioral: Negative.      Physical Exam Updated Vital Signs BP (!) 164/94   Pulse 73   Temp 98.1 F (36.7 C) (Oral)   Resp 18   Ht 5\' 10"  (1.778 m)   Wt 70.3 kg   SpO2 100%   BMI 22.24 kg/m   Physical Exam Vitals and nursing note reviewed.  Constitutional:      Appearance: He is well-developed.  HENT:     Head: Normocephalic and atraumatic.  Eyes:     Conjunctiva/sclera: Conjunctivae normal.  Cardiovascular:     Rate and Rhythm: Normal rate and regular rhythm.     Heart sounds: Normal heart sounds.  Pulmonary:     Effort: Pulmonary effort is normal.     Breath sounds: Normal breath sounds. No wheezing.  Abdominal:     General: Bowel sounds are normal.     Palpations: Abdomen is soft.     Tenderness: There is no abdominal tenderness.  Musculoskeletal:        General: Normal range of motion.     Cervical back: Normal range of motion.  Skin:    General: Skin is warm and dry.  Neurological:     General: No focal deficit present.     Mental Status: He is alert and oriented to person, place, and time.     Cranial Nerves: Cranial nerve deficit present.     Sensory: Sensation is intact.     Motor: Motor function is intact.     Coordination: Coordination is intact. Heel to [facility] Test normal. Rapid alternating movements normal.     Gait: Gait is intact.     ED Results / Procedures / Treatments   Labs (all labs ordered are listed, but only abnormal results are displayed) Labs Reviewed  BASIC METABOLIC PANEL WITH GFR  CBC    EKG None  Radiology No results found.  Procedures Procedures  {Document cardiac monitor, telemetry assessment procedure when appropriate:1}  Medications Ordered in ED Medications - No data to display  ED Course/ Medical Decision Making/ A&P   {   Click here for ABCD2, HEART and other calculatorsREFRESH Note before signing :1}                              Medical Decision Making Amount and/or Complexity of Data Reviewed Labs: ordered. Radiology: ordered.     {Document critical care time when appropriate:1} {Document review of labs and clinical decision tools ie heart score, Chads2Vasc2 etc:1}  {Document your independent review  of radiology images, and any outside records:1} {Document your discussion with family members, caretakers, and with consultants:1} {Document social determinants of health affecting pt's care:1} {Document your decision making why or why not admission, treatments were needed:1} Final Clinical Impression(s) / ED Diagnoses Final diagnoses:  None    Rx / DC Orders ED Discharge Orders     None

## 2023-06-05 NOTE — Discharge Instructions (Signed)
 As discussed we are increasing your amlodipine  from 5 mg daily to 10 mg daily.  You may double up your remaining 5 mg before starting the new prescription.  Plan follow-up with your primary provider at the [facility] for follow-up care.

## 2023-06-21 ENCOUNTER — Other Ambulatory Visit (HOSPITAL_COMMUNITY): Payer: Self-pay | Admitting: Physician Assistant

## 2023-06-21 DIAGNOSIS — Z87891 Personal history of nicotine dependence: Secondary | ICD-10-CM

## 2023-06-21 DIAGNOSIS — Z122 Encounter for screening for malignant neoplasm of respiratory organs: Secondary | ICD-10-CM

## 2023-08-06 ENCOUNTER — Other Ambulatory Visit: Payer: Self-pay

## 2023-08-06 ENCOUNTER — Emergency Department (HOSPITAL_COMMUNITY)
Admission: EM | Admit: 2023-08-06 | Discharge: 2023-08-06 | Disposition: A | Discharge status: Home / Self Care | Attending: Emergency Medicine | Admitting: Emergency Medicine

## 2023-08-06 ENCOUNTER — Encounter (HOSPITAL_COMMUNITY): Payer: Self-pay

## 2023-08-06 DIAGNOSIS — Z79899 Other long term (current) drug therapy: Secondary | ICD-10-CM | POA: Diagnosis not present

## 2023-08-06 DIAGNOSIS — E119 Type 2 diabetes mellitus without complications: Secondary | ICD-10-CM | POA: Insufficient documentation

## 2023-08-06 DIAGNOSIS — I10 Essential (primary) hypertension: Secondary | ICD-10-CM | POA: Insufficient documentation

## 2023-08-06 DIAGNOSIS — K029 Dental caries, unspecified: Secondary | ICD-10-CM | POA: Diagnosis not present

## 2023-08-06 DIAGNOSIS — T783XXA Angioneurotic edema, initial encounter: Secondary | ICD-10-CM | POA: Diagnosis not present

## 2023-08-06 DIAGNOSIS — D72829 Elevated white blood cell count, unspecified: Secondary | ICD-10-CM | POA: Diagnosis not present

## 2023-08-06 DIAGNOSIS — R600 Localized edema: Secondary | ICD-10-CM | POA: Diagnosis present

## 2023-08-06 LAB — BASIC METABOLIC PANEL WITH GFR
Anion gap: 10 (ref 5–15)
BUN: 11 mg/dL (ref 8–23)
CO2: 25 mmol/L (ref 22–32)
Calcium: 9.6 mg/dL (ref 8.9–10.3)
Chloride: 104 mmol/L (ref 98–111)
Creatinine, Ser: 1.08 mg/dL (ref 0.61–1.24)
GFR, Estimated: >60 mL/min (ref >60)
Glucose, Bld: 94 mg/dL (ref 70–99)
Potassium: 3.8 mmol/L (ref 3.5–5.1)
Sodium: 139 mmol/L (ref 135–145)

## 2023-08-06 LAB — CBC WITH DIFFERENTIAL/PLATELET
Abs Immature Granulocytes: 0.05 10*3/uL (ref 0.00–0.07)
Basophils Absolute: 0 10*3/uL (ref 0.0–0.1)
Basophils Relative: 0 %
Eosinophils Absolute: 0.1 10*3/uL (ref 0.0–0.5)
Eosinophils Relative: 1 %
HCT: 39 % (ref 39.0–52.0)
Hemoglobin: 13 g/dL (ref 13.0–17.0)
Immature Granulocytes: 0 %
Lymphocytes Relative: 20 %
Lymphs Abs: 2.6 10*3/uL (ref 0.7–4.0)
MCH: 28.2 pg (ref 26.0–34.0)
MCHC: 33.3 g/dL (ref 30.0–36.0)
MCV: 84.6 fL (ref 80.0–100.0)
Monocytes Absolute: 1 10*3/uL (ref 0.1–1.0)
Monocytes Relative: 8 %
Neutro Abs: 9.5 10*3/uL — ABNORMAL HIGH (ref 1.7–7.7)
Neutrophils Relative %: 71 %
Platelets: 318 10*3/uL (ref 150–400)
RBC: 4.61 MIL/uL (ref 4.22–5.81)
RDW: 16.6 % — ABNORMAL HIGH (ref 11.5–15.5)
WBC: 13.4 10*3/uL — ABNORMAL HIGH (ref 4.0–10.5)
nRBC: 0 % (ref 0.0–0.2)

## 2023-08-06 MED ORDER — METHYLPREDNISOLONE SODIUM SUCC 125 MG IJ SOLR
125.0000 mg | Freq: Once | INTRAMUSCULAR | Status: AC
  Administered 2023-08-06: 125 mg via INTRAVENOUS
  Filled 2023-08-06: qty 2

## 2023-08-06 MED ORDER — PREDNISONE 50 MG PO TABS: 50.0000 mg | ORAL_TABLET | Freq: Every day | ORAL | 0 refills | Status: AC

## 2023-08-06 MED ORDER — AMOXICILLIN 500 MG PO CAPS: 500.0000 mg | ORAL_CAPSULE | Freq: Three times a day (TID) | ORAL | 0 refills | Status: AC

## 2023-08-06 MED ORDER — HYDRALAZINE HCL 25 MG PO TABS: 25.0000 mg | ORAL_TABLET | Freq: Three times a day (TID) | ORAL | 0 refills | Status: AC

## 2023-08-06 MED ORDER — FAMOTIDINE IN NACL 20-0.9 MG/50ML-% IV SOLN
20.0000 mg | Freq: Once | INTRAVENOUS | Status: AC
  Administered 2023-08-06: 20 mg via INTRAVENOUS
  Filled 2023-08-06: qty 50

## 2023-08-06 MED ORDER — EPINEPHRINE 0.3 MG/0.3ML IJ SOAJ
0.3000 mg | Freq: Once | INTRAMUSCULAR | Status: AC
  Administered 2023-08-06: 0.3 mg via INTRAMUSCULAR
  Filled 2023-08-06: qty 0.3

## 2023-08-06 MED ORDER — DIPHENHYDRAMINE HCL 50 MG/ML IJ SOLN
25.0000 mg | Freq: Once | INTRAMUSCULAR | Status: AC
  Administered 2023-08-06: 25 mg via INTRAVENOUS
  Filled 2023-08-06: qty 1

## 2023-08-06 NOTE — Discharge Instructions (Addendum)
 You have had a serious reaction to Lisinopril .  I have added it to your allergy list.  DO NOT ever take this medication or any other medication in the [employer]ITOR family.  Take Hydralazine  instead of the Lisinopril .  Continue taking Amlodipine .

## 2023-08-06 NOTE — ED Notes (Signed)
 Swelling noted to upper lip. Per previous nurse, swelling is same and has not improved or worsened. Patient states breathing is slightly better but unsure about swallowing because he hasn't drank anything

## 2023-08-06 NOTE — ED Triage Notes (Signed)
 Pt coming in with very swollen upper lip and is beginning to have difficulty swallowing and breathing. No distress at this time. Pt is taking lisinopril .

## 2023-08-06 NOTE — ED Provider Notes (Signed)
 [place] EMERGENCY DEPARTMENT AT [facility] Provider Note   CSN: [id number] Arrival date & time: 08/06/23  1615     Patient presents with: Allergic Reaction   Victor Palmer is a 63 y.o. male.   Pt is a 63 yo male with pmhx significant for bipolar d/o, DM2, PTSD, HTN, GERD, and chronic back pain.  Pt said he has had some dental pain and then his lips swelled up yesterday.  He said it seemed to be worse today, so he came in.  No trouble breathing.  He does take lisinopril  for HTN.         Prior to Admission medications   Medication Sig Start Date End Date Taking? Authorizing Provider  amoxicillin (AMOXIL) 500 MG capsule Take 1 capsule (500 mg total) by mouth 3 (three) times daily. 08/06/23  Yes Dean Clarity, MD  hydrALAZINE  (APRESOLINE ) 25 MG tablet Take 1 tablet (25 mg total) by mouth 3 (three) times daily. 08/06/23  Yes Dean Clarity, MD  predniSONE  (DELTASONE ) 50 MG tablet Take 1 tablet (50 mg total) by mouth daily with breakfast. 08/06/23  Yes Dean Clarity, MD  amLODipine  (NORVASC ) 10 MG tablet Take 1 tablet (10 mg total) by mouth daily. 06/05/23   Idol, Alif Petrak, PA-C  benztropine (COGENTIN) 1 MG tablet Take 1 mg by mouth 2 (two) times daily.    [provider]  DULoxetine (CYMBALTA) 30 MG capsule Take 30 mg by mouth daily.    [provider]  gabapentin (NEURONTIN) 300 MG capsule Take 300 mg by mouth 3 (three) times daily.    [provider]  hydrALAZINE  (APRESOLINE ) 10 MG tablet Take 1 tablet (10 mg total) by mouth 3 (three) times daily as needed. Use as needed for persistent Systolic BP readings over 165 1/68/75   Silver Fell A, [place]  HYDROcodone -acetaminophen  (NORCO/VICODIN) 5-325 MG tablet Take one tab po q 4 hrs prn pain 07/23/21   Triplett, Tammy, PA-C  lisinopril  (ZESTRIL ) 40 MG tablet Take 1 tablet (40 mg total) by mouth daily. 09/26/19 11/25/19  Walisiewicz, Kaitlyn E, PA-C  methocarbamol  (ROBAXIN ) 500 MG tablet Take 1 tablet (500 mg  total) by mouth 3 (three) times daily. 07/23/21   Triplett, Tammy, PA-C  pravastatin (PRAVACHOL) 10 MG tablet Take 10 mg by mouth daily.    [provider]  predniSONE  (DELTASONE ) 10 MG tablet Take 6 tablets day one, 5 tablets day two, 4 tablets day three, 3 tablets day four, 2 tablets day five, then 1 tablet day six 07/23/21   Triplett, Tammy, PA-C  QUEtiapine (SEROQUEL) 200 MG tablet Take 200 mg by mouth 2 (two) times daily.    [provider]  traZODone (DESYREL) 50 MG tablet Take 50 mg by mouth at bedtime.    [provider]    Allergies: Lisinopril  and Contrast media [iodinated contrast media]    Review of Systems  HENT:         Lip swelling    Updated Vital Signs BP (!) 148/95 (BP Location: Right Arm)   Pulse 73   Temp 98.9 F (37.2 C) (Temporal)   Resp 14   Ht 5' 10 (1.778 m)   Wt 68 kg   SpO2 96%   BMI 21.52 kg/m   Physical Exam Vitals and nursing note reviewed.  Constitutional:      Appearance: Normal appearance.  HENT:     Head: Normocephalic and atraumatic.     Right Ear: External ear normal.     Left  Ear: External ear normal.     Nose: Nose normal.     Mouth/Throat:     Mouth: Mucous membranes are moist.     Pharynx: Oropharynx is clear.     Comments: Upper and lower lips swollen.  No swelling of the tongue or pharynx. Multiple broken teeth.  No dental abscesses.  Eyes:     Extraocular Movements: Extraocular movements intact.     Conjunctiva/sclera: Conjunctivae normal.     Pupils: Pupils are equal, round, and reactive to light.    Cardiovascular:     Rate and Rhythm: Normal rate and regular rhythm.     Pulses: Normal pulses.     Heart sounds: Normal heart sounds.  Pulmonary:     Effort: Pulmonary effort is normal.     Breath sounds: Normal breath sounds.  Abdominal:     General: Abdomen is flat.     Palpations: Abdomen is soft.   Musculoskeletal:        General: Normal range of motion.     Cervical back: Normal range of  motion and neck supple.   Skin:    General: Skin is warm.     Capillary Refill: Capillary refill takes less than 2 seconds.   Neurological:     General: No focal deficit present.     Mental Status: He is alert and oriented to person, place, and time.   Psychiatric:        Mood and Affect: Mood normal.        Behavior: Behavior normal.        Thought Content: Thought content normal.        Judgment: Judgment normal.     (all labs ordered are listed, but only abnormal results are displayed) Labs Reviewed  CBC WITH DIFFERENTIAL/PLATELET - Abnormal; Notable for the following components:      Result Value   WBC 13.4 (*)    RDW 16.6 (*)    Neutro Abs 9.5 (*)    All other components within normal limits  BASIC METABOLIC PANEL WITH GFR    EKG: EKG Interpretation Date/Time:  Saturday August 06 2023 17:03:51 EDT Ventricular Rate:  87 PR Interval:  154 QRS Duration:  90 QT Interval:  372 QTC Calculation: 447 R Axis:   74  Text Interpretation: Normal sinus rhythm Right atrial enlargement Minimal voltage criteria for LVH, may be normal variant ( Sokolow-Lyon ) Nonspecific ST and T wave abnormality Abnormal ECG When compared with ECG of 05-Jun-2023 11:32, PREVIOUS ECG IS PRESENT No significant change since last tracing Confirmed by Dean Clarity [phone]) on 08/06/2023 5:13:24 PM  Radiology: No results found.   Procedures   Medications Ordered in the ED  diphenhydrAMINE  (BENADRYL ) injection 25 mg (25 mg Intravenous Given 08/06/23 1642)  EPINEPHrine  (EPI-PEN) injection 0.3 mg (0.3 mg Intramuscular Given 08/06/23 1636)  methylPREDNISolone  sodium succinate (SOLU-MEDROL ) 125 mg/2 mL injection 125 mg (125 mg Intravenous Given 08/06/23 1642)  famotidine  (PEPCID ) IVPB 20 mg premix (0 mg Intravenous Stopped 08/06/23 1821)                                    Medical Decision Making Amount and/or Complexity of Data Reviewed Labs: ordered.  Risk Prescription drug management.   This  patient presents to the ED for concern of swollen lips, this involves an extensive number of treatment options, and is a complaint that carries with it a high risk  of complications and morbidity.  The differential diagnosis includes angioedema, infection   Co morbidities that complicate the patient evaluation  bipolar d/o, DM2, PTSD, HTN, GERD, and chronic back pain   Additional history obtained:  Additional history obtained from epic chart review  Lab Tests:  I Ordered, and personally interpreted labs.  The pertinent results include:  cbc with wbc elevated at 13.4, bmp nl   Cardiac Monitoring:  The patient was maintained on a cardiac monitor.  I personally viewed and interpreted the cardiac monitored which showed an underlying rhythm of: nsr   Medicines ordered and prescription drug management:  I ordered medication including epi, benadryl , solumedrol, pepcid   for sx  Reevaluation of the patient after these medicines showed that the patient improved I have reviewed the patients home medicines and have made adjustments as needed   Critical Interventions:  epi   Problem List / ED Course:  Angioedema: likely due to lisinopril .  Lower lip swelling has almost completely gone down.  Upper lip is still swollen, but improved.  Pt has no tongue or pharyngeal involvement.  Pt has been observed several hours and is stable for d/c.  Return if worse.  Do not take lisinopril .   Dental caries:  pt started on amox.  He is to f/u with the dentist. HTN:  pt is to take hydralazine  instead of lisinopril .  He is to continue taking amlodipine .   Reevaluation:  After the interventions noted above, I reevaluated the patient and found that they have :improved   Social Determinants of Health:  Lives at home   Dispostion:  After consideration of the diagnostic results and the patients response to treatment, I feel that the patent would benefit from discharge with outpatient  f/u.  CRITICAL CARE Performed by: Mliss Boyers   Total critical care time: 30 minutes  Critical care time was exclusive of separately billable procedures and treating other patients.  Critical care was necessary to treat or prevent imminent or life-threatening deterioration.  Critical care was time spent personally by me on the following activities: development of treatment plan with patient and/or surrogate as well as nursing, discussions with consultants, evaluation of patient's response to treatment, examination of patient, obtaining history from patient or surrogate, ordering and performing treatments and interventions, ordering and review of laboratory studies, ordering and review of radiographic studies, pulse oximetry and re-evaluation of patient's condition.        Final diagnoses:  Angioedema, initial encounter  Dental caries    ED Discharge Orders          Ordered    hydrALAZINE  (APRESOLINE ) 25 MG tablet  3 times daily        08/06/23 2031    predniSONE  (DELTASONE ) 50 MG tablet  Daily with breakfast        08/06/23 2031    amoxicillin (AMOXIL) 500 MG capsule  3 times daily        08/06/23 2032               Boyers Mliss, MD 08/06/23 2033

## 2023-08-16 ENCOUNTER — Ambulatory Visit (HOSPITAL_COMMUNITY): Admission: RE | Admit: 2023-08-16 | Source: Ambulatory Visit

## 2023-08-16 ENCOUNTER — Encounter (HOSPITAL_COMMUNITY): Payer: Self-pay

## 2024-02-08 IMAGING — CT CT T SPINE W/O CM
3 of 5 series · 10 of 33 positions shown, 11 images · non-contrast
Comparison: CT cervical earlier same day.

CLINICAL DATA: Thoracic region back pain over the last week.
Compression fracture suspected. Right shoulder pain and neck
stiffness.

EXAM:
CT THORACIC SPINE WITHOUT CONTRAST
TECHNIQUE: Multidetector CT images of the thoracic were obtained using the
standard protocol without intravenous contrast.
RADIATION DOSE REDUCTION: This exam was performed according to the
departmental dose-optimization program which includes automated
exposure control, adjustment of the mA and/or kV according to
patient size and/or use of iterative reconstruction technique.

[Series 3: t spine bone · axial · 0.41mm/px · z∈[+1398,+1506]mm · 2 of 162 slices shown, 3 images]
[im 54/162  soft-tissue]
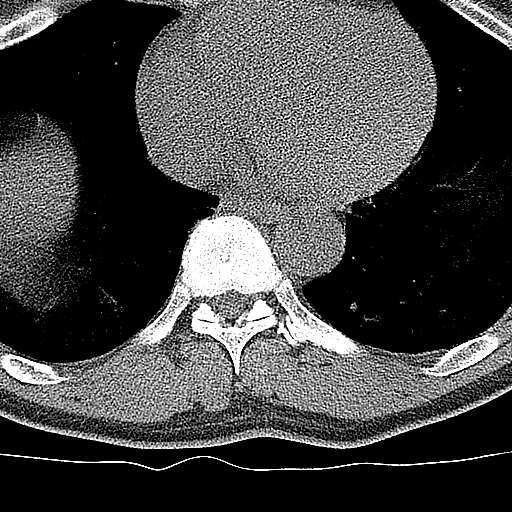
[im 54/162  bone]
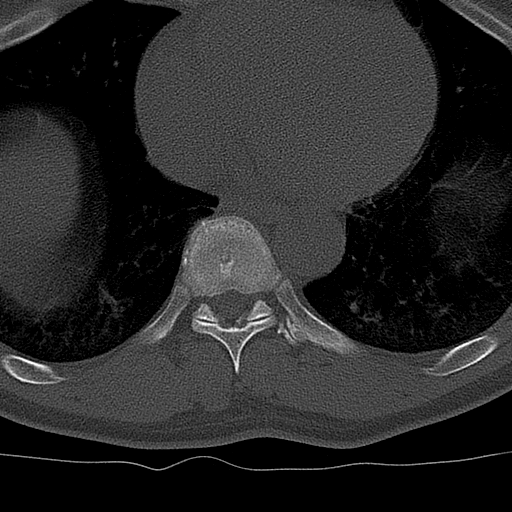
[im 108/162  bone]
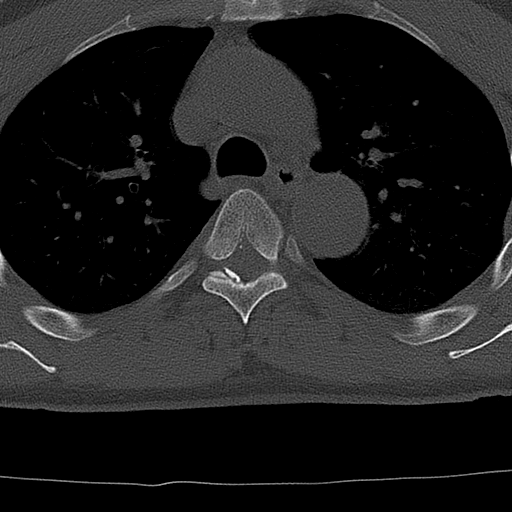

[Series 5: sag bone · sagittal · 0.38mm/px · 5 of 61 slices shown]
[im 21/61  bone]
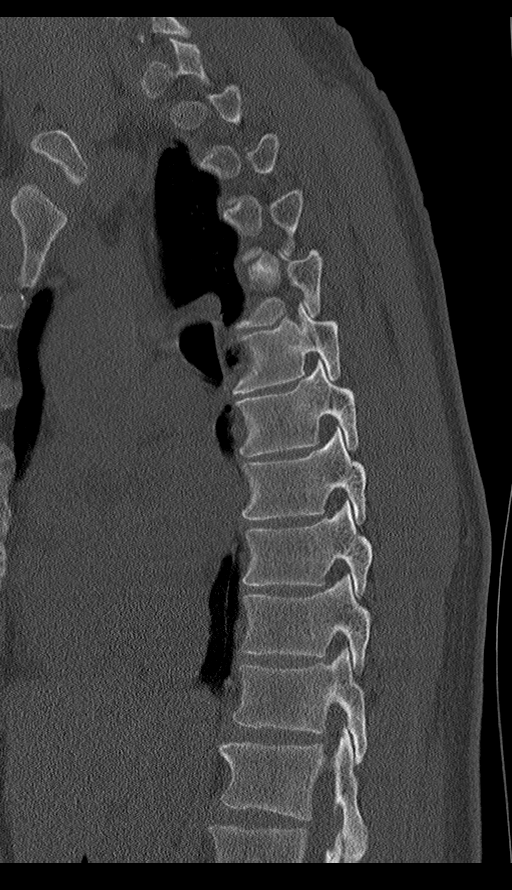
[im 26/61  bone]
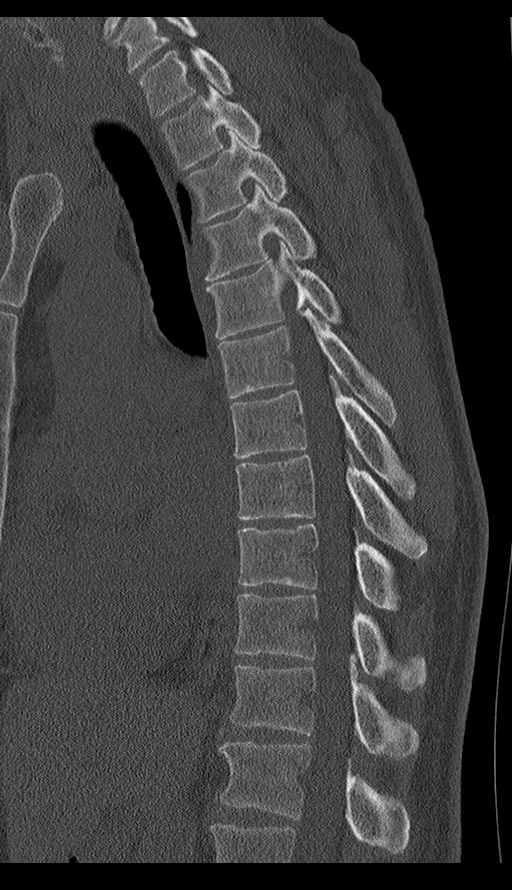
[im 31/61  bone]
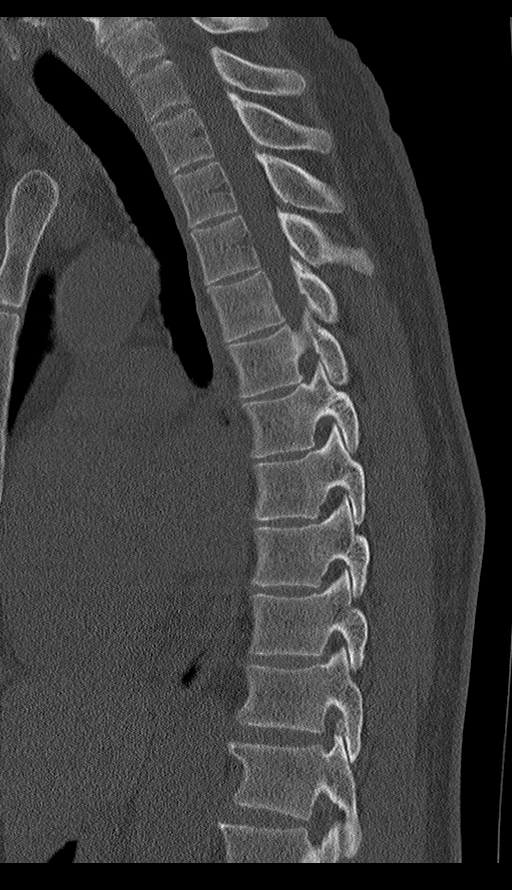
[im 36/61  bone]
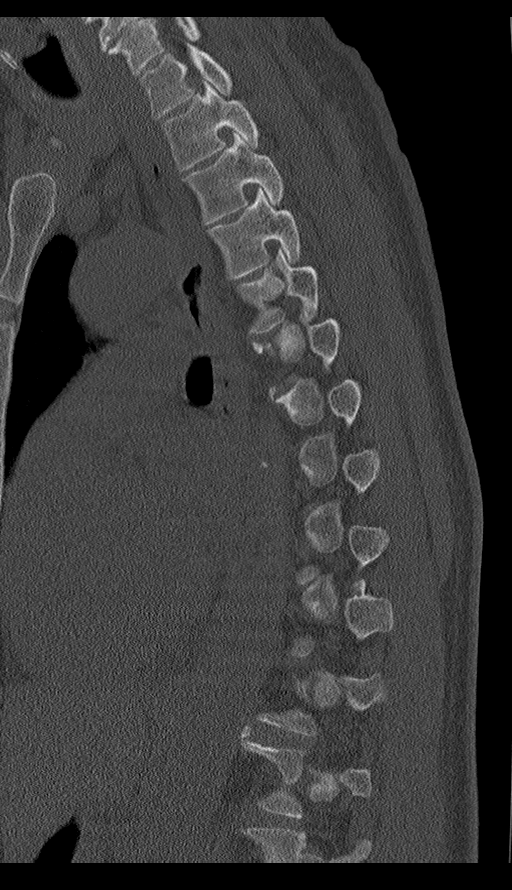
[im 41/61  bone]
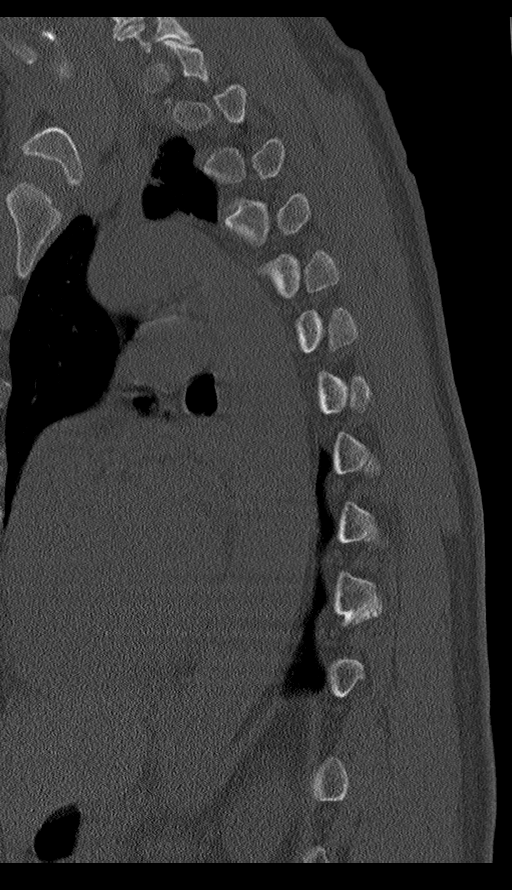

[Series 6: cor bone · coronal · 0.23mm/px · 3 of 91 slices shown]
[im 19/91  bone]
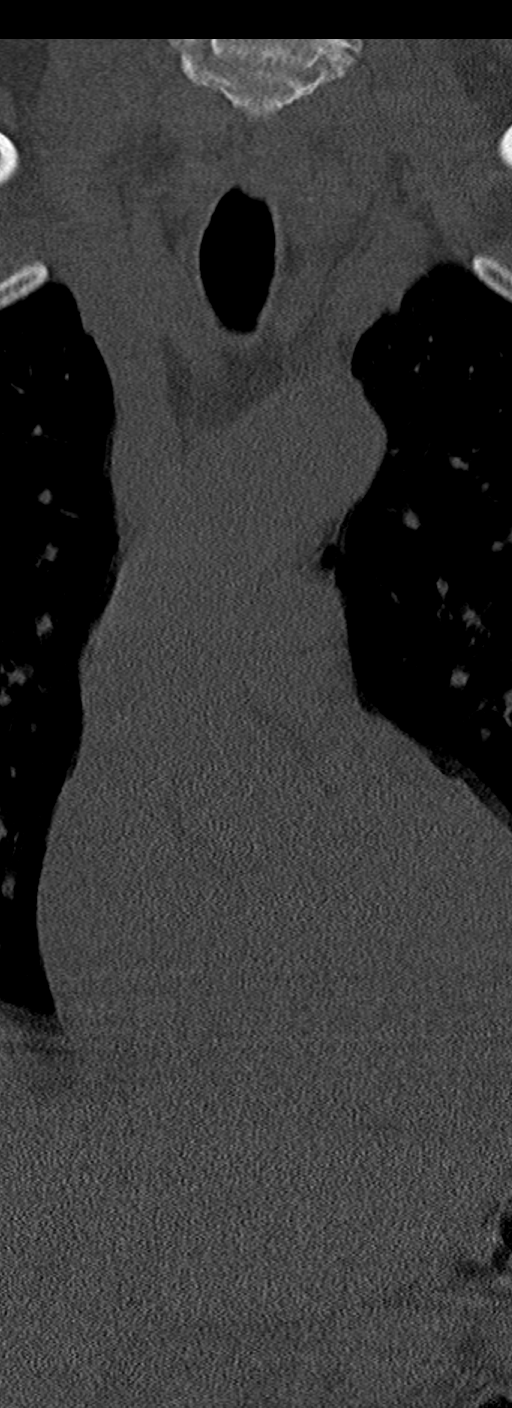
[im 37/91  bone]
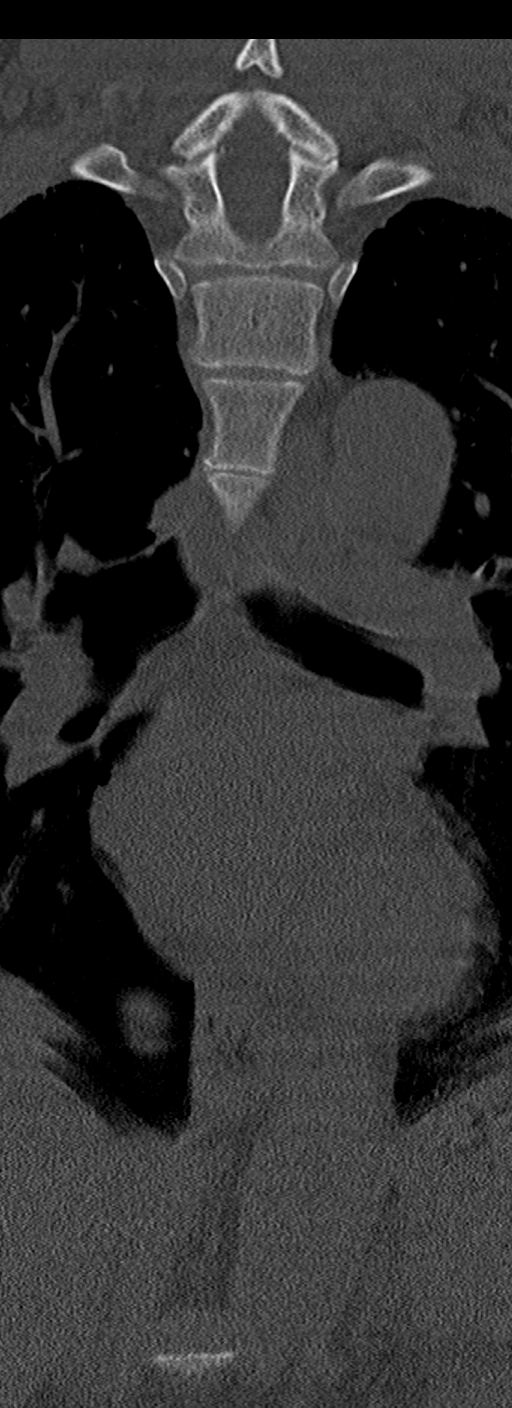
[im 55/91  bone]
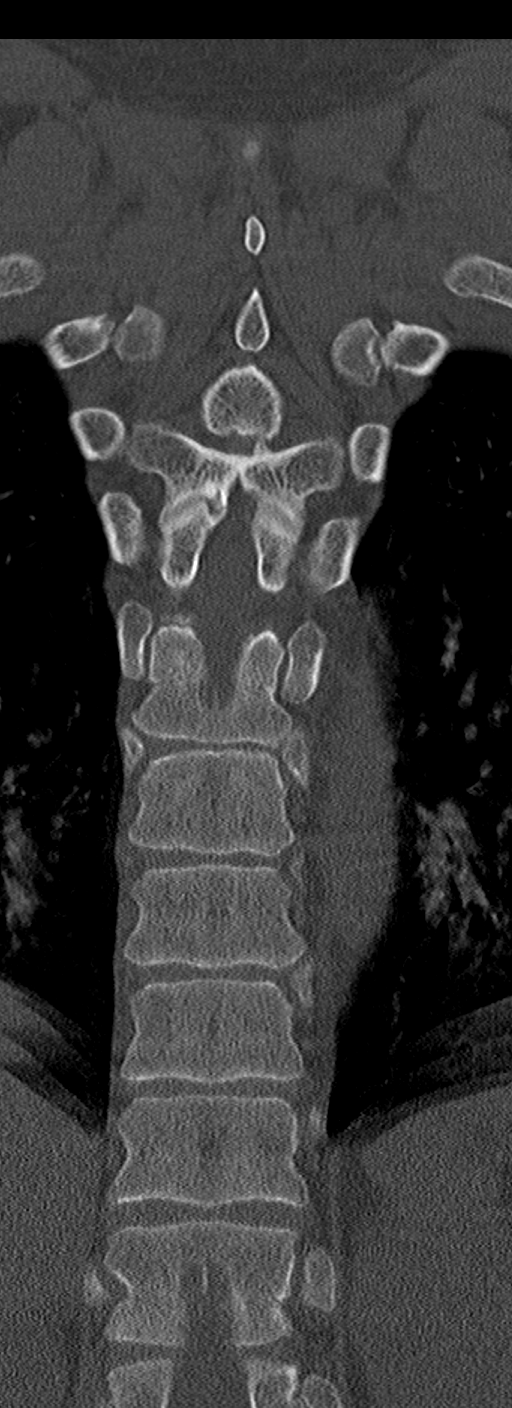

[10 of 33 positions shown; findings below may reference images not displayed]

FINDINGS: Alignment: No malalignment.

Vertebrae: No evidence of thoracic region compression fracture or
focal bone lesion.

Paraspinal and other soft tissues: Negative

Disc levels: No evidence of thoracic region disc degeneration. No
visible bulge or herniation. No apparent compressive stenosis of the
canal or foramina. Mild posterior ligamentous prominence and
calcification on the right at T5-6 and bilateral from T7-8 through
T11-12.
IMPRESSION: No regional fracture.

No thoracic region degenerative disc disease.

Ordinary mild posterior ligamentous hypertrophy and calcification at
a few levels as outlined above, but without likely compressive
stenosis of the canal or foramina.
